# Patient Record
Sex: Female | Born: 1960 | Race: Black or African American | Hispanic: No | Smoking: Former smoker
Health system: Southern US, Community
[De-identification: ages and names within clinical notes are randomized; demographics above are authoritative.]

## PROBLEM LIST (undated history)

## (undated) DIAGNOSIS — R569 Unspecified convulsions: Secondary | ICD-10-CM

## (undated) DIAGNOSIS — E119 Type 2 diabetes mellitus without complications: Secondary | ICD-10-CM

## (undated) DIAGNOSIS — R7302 Impaired glucose tolerance (oral): Secondary | ICD-10-CM

## (undated) DIAGNOSIS — G4700 Insomnia, unspecified: Secondary | ICD-10-CM

## (undated) DIAGNOSIS — R42 Dizziness and giddiness: Secondary | ICD-10-CM

## (undated) DIAGNOSIS — K219 Gastro-esophageal reflux disease without esophagitis: Secondary | ICD-10-CM

## (undated) DIAGNOSIS — F329 Major depressive disorder, single episode, unspecified: Secondary | ICD-10-CM

## (undated) DIAGNOSIS — R7611 Nonspecific reaction to tuberculin skin test without active tuberculosis: Secondary | ICD-10-CM

## (undated) DIAGNOSIS — J4530 Mild persistent asthma, uncomplicated: Secondary | ICD-10-CM

## (undated) DIAGNOSIS — D259 Leiomyoma of uterus, unspecified: Secondary | ICD-10-CM

## (undated) DIAGNOSIS — F32A Depression, unspecified: Secondary | ICD-10-CM

## (undated) DIAGNOSIS — I10 Essential (primary) hypertension: Secondary | ICD-10-CM

## (undated) DIAGNOSIS — N183 Chronic kidney disease, stage 3 unspecified: Secondary | ICD-10-CM

## (undated) DIAGNOSIS — R079 Chest pain, unspecified: Secondary | ICD-10-CM

## (undated) DIAGNOSIS — G43911 Migraine, unspecified, intractable, with status migrainosus: Secondary | ICD-10-CM

## (undated) DIAGNOSIS — I739 Peripheral vascular disease, unspecified: Secondary | ICD-10-CM

## (undated) DIAGNOSIS — I6509 Occlusion and stenosis of unspecified vertebral artery: Secondary | ICD-10-CM

## (undated) DIAGNOSIS — Z8673 Personal history of transient ischemic attack (TIA), and cerebral infarction without residual deficits: Secondary | ICD-10-CM

## (undated) DIAGNOSIS — F411 Generalized anxiety disorder: Secondary | ICD-10-CM

## (undated) DIAGNOSIS — Z9289 Personal history of other medical treatment: Secondary | ICD-10-CM

## (undated) HISTORY — DX: Depression, unspecified: F32.A

## (undated) HISTORY — DX: Occlusion and stenosis of unspecified vertebral artery: I65.09

## (undated) HISTORY — DX: Chronic kidney disease, stage 3 (moderate): N18.3

## (undated) HISTORY — DX: Unspecified convulsions: R56.9

## (undated) HISTORY — DX: Personal history of transient ischemic attack (TIA), and cerebral infarction without residual deficits: Z86.73

## (undated) HISTORY — DX: Dizziness and giddiness: R42

## (undated) HISTORY — DX: Major depressive disorder, single episode, unspecified: F32.9

## (undated) HISTORY — DX: Nonspecific reaction to tuberculin skin test without active tuberculosis: R76.11

## (undated) HISTORY — DX: Peripheral vascular disease, unspecified: I73.9

## (undated) HISTORY — DX: Chronic kidney disease, stage 3 unspecified: N18.30

## (undated) HISTORY — DX: Mild persistent asthma, uncomplicated: J45.30

## (undated) HISTORY — DX: Leiomyoma of uterus, unspecified: D25.9

## (undated) HISTORY — DX: Migraine, unspecified, intractable, with status migrainosus: G43.911

## (undated) HISTORY — DX: Impaired glucose tolerance (oral): R73.02

## (undated) HISTORY — DX: Insomnia, unspecified: G47.00

## (undated) HISTORY — DX: Essential (primary) hypertension: I10

## (undated) HISTORY — DX: Chest pain, unspecified: R07.9

## (undated) HISTORY — DX: Generalized anxiety disorder: F41.1

## (undated) HISTORY — DX: Gastro-esophageal reflux disease without esophagitis: K21.9

## (undated) HISTORY — DX: Personal history of other medical treatment: Z92.89

---

## 1993-05-08 HISTORY — PX: PARTIAL HYSTERECTOMY: SHX80

## 2012-05-08 HISTORY — PX: COLONOSCOPY: SHX174

## 2015-02-10 ENCOUNTER — Ambulatory Visit (INDEPENDENT_AMBULATORY_CARE_PROVIDER_SITE_OTHER): Payer: 59 | Admitting: Internal Medicine

## 2015-02-10 ENCOUNTER — Other Ambulatory Visit (INDEPENDENT_AMBULATORY_CARE_PROVIDER_SITE_OTHER): Payer: 59

## 2015-02-10 ENCOUNTER — Encounter: Payer: Self-pay | Admitting: Internal Medicine

## 2015-02-10 VITALS — BP 120/80 | HR 52 | Temp 98.6°F | Ht 65.0 in | Wt 249.0 lb

## 2015-02-10 DIAGNOSIS — F329 Major depressive disorder, single episode, unspecified: Secondary | ICD-10-CM

## 2015-02-10 DIAGNOSIS — G43911 Migraine, unspecified, intractable, with status migrainosus: Secondary | ICD-10-CM

## 2015-02-10 DIAGNOSIS — N183 Chronic kidney disease, stage 3 unspecified: Secondary | ICD-10-CM | POA: Insufficient documentation

## 2015-02-10 DIAGNOSIS — R739 Hyperglycemia, unspecified: Secondary | ICD-10-CM

## 2015-02-10 DIAGNOSIS — Z23 Encounter for immunization: Secondary | ICD-10-CM | POA: Diagnosis not present

## 2015-02-10 DIAGNOSIS — Z8673 Personal history of transient ischemic attack (TIA), and cerebral infarction without residual deficits: Secondary | ICD-10-CM

## 2015-02-10 DIAGNOSIS — R0789 Other chest pain: Secondary | ICD-10-CM

## 2015-02-10 DIAGNOSIS — G43909 Migraine, unspecified, not intractable, without status migrainosus: Secondary | ICD-10-CM | POA: Insufficient documentation

## 2015-02-10 DIAGNOSIS — I10 Essential (primary) hypertension: Secondary | ICD-10-CM

## 2015-02-10 DIAGNOSIS — G4700 Insomnia, unspecified: Secondary | ICD-10-CM | POA: Diagnosis not present

## 2015-02-10 DIAGNOSIS — K219 Gastro-esophageal reflux disease without esophagitis: Secondary | ICD-10-CM

## 2015-02-10 DIAGNOSIS — F32A Depression, unspecified: Secondary | ICD-10-CM

## 2015-02-10 DIAGNOSIS — J4530 Mild persistent asthma, uncomplicated: Secondary | ICD-10-CM

## 2015-02-10 DIAGNOSIS — J45901 Unspecified asthma with (acute) exacerbation: Secondary | ICD-10-CM | POA: Insufficient documentation

## 2015-02-10 DIAGNOSIS — R7611 Nonspecific reaction to tuberculin skin test without active tuberculosis: Secondary | ICD-10-CM

## 2015-02-10 DIAGNOSIS — F411 Generalized anxiety disorder: Secondary | ICD-10-CM | POA: Insufficient documentation

## 2015-02-10 LAB — CBC WITH DIFFERENTIAL/PLATELET
BASOS ABS: 0.1 10*3/uL (ref 0.0–0.1)
Basophils Relative: 1.1 % (ref 0.0–3.0)
EOS PCT: 0.9 % (ref 0.0–5.0)
Eosinophils Absolute: 0.1 10*3/uL (ref 0.0–0.7)
HCT: 41.3 % (ref 36.0–46.0)
Hemoglobin: 13.5 g/dL (ref 12.0–15.0)
LYMPHS ABS: 2.5 10*3/uL (ref 0.7–4.0)
Lymphocytes Relative: 35.8 % (ref 12.0–46.0)
MCHC: 32.8 g/dL (ref 30.0–36.0)
MCV: 88.5 fl (ref 78.0–100.0)
MONO ABS: 0.6 10*3/uL (ref 0.1–1.0)
Monocytes Relative: 8.6 % (ref 3.0–12.0)
NEUTROS PCT: 53.6 % (ref 43.0–77.0)
Neutro Abs: 3.7 10*3/uL (ref 1.4–7.7)
Platelets: 294 10*3/uL (ref 150.0–400.0)
RBC: 4.66 Mil/uL (ref 3.87–5.11)
RDW: 14.2 % (ref 11.5–15.5)
WBC: 7 10*3/uL (ref 4.0–10.5)

## 2015-02-10 LAB — COMPREHENSIVE METABOLIC PANEL
ALK PHOS: 81 U/L (ref 39–117)
ALT: 17 U/L (ref 0–35)
AST: 16 U/L (ref 0–37)
Albumin: 4.2 g/dL (ref 3.5–5.2)
BUN: 17 mg/dL (ref 6–23)
CO2: 27 mEq/L (ref 19–32)
Calcium: 10.1 mg/dL (ref 8.4–10.5)
Chloride: 100 mEq/L (ref 96–112)
Creatinine, Ser: 1.1 mg/dL (ref 0.40–1.20)
GFR: 66.87 mL/min (ref >60.00)
GLUCOSE: 91 mg/dL (ref 70–99)
POTASSIUM: 3.7 meq/L (ref 3.5–5.1)
SODIUM: 135 meq/L (ref 135–145)
TOTAL PROTEIN: 7.4 g/dL (ref 6.0–8.3)
Total Bilirubin: 0.5 mg/dL (ref 0.2–1.2)

## 2015-02-10 LAB — LIPID PANEL
CHOL/HDL RATIO: 3
Cholesterol: 179 mg/dL (ref 0–200)
HDL: 51.7 mg/dL (ref >39.00)
LDL CALC: 93 mg/dL (ref 0–99)
NONHDL: 126.86
Triglycerides: 167 mg/dL — ABNORMAL HIGH (ref 0.0–149.0)
VLDL: 33.4 mg/dL (ref 0.0–40.0)

## 2015-02-10 LAB — HEMOGLOBIN A1C: Hgb A1c MFr Bld: 6.4 % (ref 4.6–6.5)

## 2015-02-10 LAB — TSH: TSH: 1.47 u[IU]/mL (ref 0.35–4.50)

## 2015-02-10 MED ORDER — BUPROPION HCL ER (XL) 300 MG PO TB24: 300.0000 mg | ORAL_TABLET | Freq: Every day | ORAL | Status: DC

## 2015-02-10 MED ORDER — CLOPIDOGREL BISULFATE 75 MG PO TABS: 75.0000 mg | ORAL_TABLET | Freq: Every day | ORAL | Status: DC

## 2015-02-10 MED ORDER — BUPROPION HCL ER (XL) 150 MG PO TB24: 150.0000 mg | ORAL_TABLET | Freq: Every day | ORAL | Status: DC

## 2015-02-10 MED ORDER — MONTELUKAST SODIUM 10 MG PO TABS: 10.0000 mg | ORAL_TABLET | Freq: Every day | ORAL | Status: DC

## 2015-02-10 NOTE — Assessment & Plan Note (Signed)
Well controlled today Continue current medication Check cmp

## 2015-02-10 NOTE — Assessment & Plan Note (Signed)
Takes a prescribed medication, but it does not help - advised her to stop it

## 2015-02-10 NOTE — Assessment & Plan Note (Signed)
Not controlled Restart prilosec Weight loss Avoid GERD triggers which we reviewed

## 2015-02-10 NOTE — Assessment & Plan Note (Signed)
Need to obtain old records Will refer to neuro Continue statin and plavix bp well controlled

## 2015-02-10 NOTE — Assessment & Plan Note (Signed)
Persistent and currently not controlled Her seasonal allergies and GERD are not controlled Start singulair Start prilosec Stressed changes in lifestyle to better control GERD

## 2015-02-10 NOTE — Assessment & Plan Note (Signed)
Check blood work Will refer to nephrology bp well controlled today

## 2015-02-10 NOTE — Patient Instructions (Signed)
Please have your records transferred.   Test(s) ordered today. Your results will be released to [place] (or called to you) after review, usually within 72hours after test completion. If any changes need to be made, you will be notified at that same time.  All other Health Maintenance issues reviewed.   All recommended immunizations and age-appropriate screenings are up-to-date.  Flu and tetanus administered today.   Medications reviewed and updated.  We will start Singulair today to help  Improve your asthma control.  You should also start back on the prilosec and take it daily.  Make the lifestyle changes we discussed to better control the heartburn.     Your prescription(s) have been submitted to your pharmacy. Please take as directed and contact our office if you believe you are having problem(s) with the medication(s).  A referral was ordered for cardiology, nephrology, neurology and psychiatry.    Please schedule followup in 3 months.  Gastroesophageal Reflux Disease, Adult Normally, food travels down the esophagus and stays in the stomach to be digested. However, when a person has gastroesophageal reflux disease (GERD), food and stomach acid move back up into the esophagus. When this happens, the esophagus becomes sore and inflamed. Over time, GERD can create small holes (ulcers) in the lining of the esophagus.  CAUSES This condition is caused by a problem with the muscle between the esophagus and the stomach (lower esophageal sphincter, or LES). Normally, the LES muscle closes after food passes through the esophagus to the stomach. When the LES is weakened or abnormal, it does not close properly, and that allows food and stomach acid to go back up into the esophagus. The LES can be weakened by certain dietary substances, medicines, and medical conditions, including:  Tobacco use.  Pregnancy.  Having a hiatal hernia.  Heavy alcohol use.  Certain foods and beverages, such as  coffee, chocolate, onions, and peppermint. RISK FACTORS This condition is more likely to develop in:  People who have an increased body weight.  People who have connective tissue disorders.  People who use NSAID medicines. SYMPTOMS Symptoms of this condition include:  Heartburn.  Difficult or painful swallowing.  The feeling of having a lump in the throat.  Abitter taste in the mouth.  Bad breath.  Having a large amount of saliva.  Having an upset or bloated stomach.  Belching.  Chest pain.  Shortness of breath or wheezing.  Ongoing (chronic) cough or a night-time cough.  Wearing away of tooth enamel.  Weight loss. Different conditions can cause chest pain. Make sure to see your health care provider if you experience chest pain. DIAGNOSIS Your health care provider will take a medical history and perform a physical exam. To determine if you have mild or severe GERD, your health care provider may also monitor how you respond to treatment. You may also have other tests, including:  An endoscopy toexamine your stomach and esophagus with a small camera.  A test thatmeasures the acidity level in your esophagus.  A test thatmeasures how much pressure is on your esophagus.  A barium swallow or modified barium swallow to show the shape, size, and functioning of your esophagus. TREATMENT The goal of treatment is to help relieve your symptoms and to prevent complications. Treatment for this condition may vary depending on how severe your symptoms are. Your health care provider may recommend:  Changes to your diet.  Medicine.  Surgery. HOME CARE INSTRUCTIONS Diet  Follow a diet as recommended by your health  care provider. This may involve avoiding foods and drinks such as:  Coffee and tea (with or without caffeine).  Drinks that containalcohol.  Energy drinks and sports drinks.  Carbonated drinks or sodas.  Chocolate and cocoa.  Peppermint and mint  flavorings.  Garlic and onions.  Horseradish.  Spicy and acidic foods, including peppers, chili powder, curry powder, vinegar, hot sauces, and barbecue sauce.  Citrus fruit juices and citrus fruits, such as oranges, lemons, and limes.  Tomato-based foods, such as red sauce, chili, salsa, and pizza with red sauce.  Fried and fatty foods, such as donuts, french fries, potato chips, and high-fat dressings.  High-fat meats, such as hot dogs and fatty cuts of red and white meats, such as rib eye steak, sausage, ham, and bacon.  High-fat dairy items, such as whole milk, butter, and cream cheese.  Eat small, frequent meals instead of large meals.  Avoid drinking large amounts of liquid with your meals.  Avoid eating meals during the 2-3 hours before bedtime.  Avoid lying down right after you eat.  Do not exercise right after you eat. General Instructions  Pay attention to any changes in your symptoms.  Take over-the-counter and prescription medicines only as told by your health care provider. Do not take aspirin, ibuprofen, or other NSAIDs unless your health care provider told you to do so.  Do not use any tobacco products, including cigarettes, chewing tobacco, and e-cigarettes. If you need help quitting, ask your health care provider.  Wear loose-fitting clothing. Do not wear anything tight around your waist that causes pressure on your abdomen.  Raise (elevate) the head of your bed 6 inches (15cm).  Try to reduce your stress, such as with yoga or meditation. If you need help reducing stress, ask your health care provider.  If you are overweight, reduce your weight to an amount that is healthy for you. Ask your health care provider for guidance about a safe weight loss goal.  Keep all follow-up visits as told by your health care provider. This is important. SEEK MEDICAL CARE IF:  You have new symptoms.  You have unexplained weight loss.  You have difficulty swallowing,  or it hurts to swallow.  You have wheezing or a persistent cough.  Your symptoms do not improve with treatment.  You have a hoarse voice. SEEK IMMEDIATE MEDICAL CARE IF:  You have pain in your arms, neck, jaw, teeth, or back.  You feel sweaty, dizzy, or light-headed.  You have chest pain or shortness of breath.  You vomit and your vomit looks like blood or coffee grounds.  You faint.  Your stool is bloody or black.  You cannot swallow, drink, or eat.   This information is not intended to replace advice given to you by your health care provider. Make sure you discuss any questions you have with your health care provider.   Document Released: 02/01/2005 Document Revised: 01/13/2015 Document Reviewed: 08/19/2014 Elsevier Interactive Patient Education [employer].

## 2015-02-10 NOTE — Progress Notes (Signed)
Subjective:    Patient ID: Michelle Nichols, female    DOB: Apr 28, 1961, 54 y.o.   MRN: [id number]  HPI She is here today to establish with a new pcp.  She is new to the area.  She would like a flu shot and needs a referral to several specialists she was following with where she used to live.   She has hypertension, chronic kidney disease and has been following with a kidney doctor.  She needs a referral.  Her last blood work was a few months ago.  She has asthma and has cough, wheezing and sob.  She uses both inhalers as prescribed.  Her seasonal allergies are not controlled and she has daily gerd symptoms.  She has taken prilosec in the past but is not currently taking it.  She is not taken any allergy medication.  Stroke:  She has had a stroke in the past that presented like a seizure.  She has no residual symptoms.  She was following with a neurologist and takes plavix.  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  She was also told she has blocked arteries in her right thigh and left groin.  She has chest pain intermittently.  She denies pain with activity.  She would like to see a cardiologist.   She has lightheadedness with changes in position.  She denies dizziness.    Depression, anxiety, insomnia:  She take wellbutrin and was following with a psychiatrist.  She would like to be referred to a psychiatrist.    Medications and allergies reviewed with patient and updated if appropriate.  Patient Active Problem List   Diagnosis Date Noted  . GERD (gastroesophageal reflux disease) 02/10/2015  . Chronic kidney disease, stage III (moderate) 02/10/2015  . Insomnia 02/10/2015  . Depression 02/10/2015  . Anxiety state 02/10/2015  . History of stroke without residual deficits 02/10/2015  . Asthma 02/10/2015  . Essential hypertension 02/10/2015  . Migraines 02/10/2015  . 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 Uterine fibroid     Past Surgical  History  Procedure Laterality Date  . Partial hysterectomy  1995    for fibroids  . Colonoscopy  2014  . Jerrye Bushy    . Ckd      Social History   Social History  . Marital Status: Divorced    Spouse Name: N/A  . Number of Children: N/A  . Years of Education: N/A   Social History Main Topics  . Smoking status: Former Smoker -- 1.50 packs/day for 20 years    Types: Cigarettes  . Smokeless tobacco: Never Used  . Alcohol Use: No  . Drug Use: No  . Sexual Activity: Not on file   Other Topics Concern  . Not on file   Social History Narrative   Live in companion      No regular exercise    Review of Systems  Constitutional: Positive for fatigue. Negative for fever, chills and unexpected weight change.       Insomnia  HENT: Positive for congestion (related to allergies) and rhinorrhea. Negative for sore throat.   Respiratory: Positive for cough, shortness of breath and wheezing.   Cardiovascular: Positive for chest pain. Negative for palpitations and leg swelling.  Gastrointestinal: Negative for vomiting, abdominal pain, diarrhea, constipation and blood in stool.       GERD daily  Neurological: Positive for light-headedness and headaches (migraines). Negative for dizziness, weakness and numbness.  Psychiatric/Behavioral: Positive for sleep disturbance and dysphoric mood. Negative for suicidal ideas. The patient is nervous/anxious.        Objective:   Filed Vitals:   02/10/15 1502  BP: 120/80  Pulse: 52  Temp: 98.6 F (37 C)   Filed Weights   02/10/15 1502  Weight: 249 lb (112.946 kg)   Body mass index is 41.44 kg/(m^2).   Physical Exam  Constitutional: She is oriented to person, place, and time. She appears well-developed and well-nourished. No distress.  obese  HENT:  Head: Normocephalic and atraumatic.  Right Ear: External ear normal.  Left Ear: External ear normal.  Mouth/Throat: Oropharynx is clear and moist.  Eyes: Conjunctivae are normal.  Neck: Neck  supple. No tracheal deviation present. No thyromegaly present.  Cardiovascular: Normal rate, regular rhythm and normal heart sounds.   No murmur heard. Pulmonary/Chest: Effort normal and breath sounds normal. No respiratory distress. She has no wheezes.  Abdominal: Soft. There is no tenderness.  Musculoskeletal: She exhibits no edema.  Lymphadenopathy:    She has no cervical adenopathy.  Neurological: She is alert and oriented to person, place, and time.  Skin: Skin is warm and dry. No rash noted.  Psychiatric: She has a normal mood and affect. Her behavior is normal.          Assessment & Plan:   Flu and tetanus today  See Problem List.  Chest pain Somewhat chronic and intermittent Not related to exertion Will refer to cardio - may warrant stress test given history  Hyperglycemia History of Will check a1c  She will get blood work done Follow up in 3 months, sooner if needed    More than 50 % of the 45 minute visit was spent coordinating care.

## 2015-02-10 NOTE — Assessment & Plan Note (Signed)
Feels depressed, not suicidal  Will refer to psych for anxiety, depression

## 2015-02-10 NOTE — Progress Notes (Signed)
Pre visit review using our clinic review tool, if applicable. No additional management support is needed unless otherwise documented below in the visit note. 

## 2015-02-10 NOTE — Assessment & Plan Note (Signed)
No treatment received, cxr neg asymptomatic

## 2015-02-11 ENCOUNTER — Encounter: Payer: Self-pay | Admitting: Internal Medicine

## 2015-02-11 DIAGNOSIS — R7303 Prediabetes: Secondary | ICD-10-CM

## 2015-02-15 MED ORDER — GLUCOSE BLOOD VI STRP
ORAL_STRIP | Status: DC
Start: 2015-02-15 — End: 2015-02-24

## 2015-02-15 MED ORDER — ONETOUCH ULTRASOFT LANCETS MISC: Status: DC

## 2015-02-15 MED ORDER — BLOOD GLUCOSE MONITOR KIT: PACK | Status: AC

## 2015-02-15 NOTE — Progress Notes (Signed)
Cardiology Office Note   Date:  02/16/2015   ID:  Michelle Nichols, DOB Jun 11, 1960, MRN [id number]  PCP:  Binnie Rail, MD  Cardiologist:  New - Dr. Quay Burow   Electrophysiologist:  n/a  Chief Complaint  Patient presents with  . Chest Pain     History of Present Illness: Michelle Nichols is a 54 y.o. female with a hx of HTN, CKD, asthma, allergic rhinitis, GERD, prior stroke, PAD, depression/anxiety.  Recently established with her PCP and is referred for evaluation of chest pain.    She moved here from [place] in July 2016.  She was previously followed by Dr. Jacquelyne Balint in [place], [place]. She notes a hx of PAD and vertebral artery stenosis. She had a stroke in 2012.  She tells me she had an echo that was "ok."  She also notes a hx of an ETT 2 years ago that was "ok."  She notes a hx of positional dizziness that seems to occur with lying flat.  She probably describes central vertigo related to her vertebral artery stenosis and prior CVA.  She started seeing Dr. Fredirick Lathe a couple years ago for LE claudication.  She had ABIs and PV angiogram.  She probably has R SFA and L CIA stenosis by her description.  She was seen by vascular surgery and was told she did not need surgery.  She was advised to start an exercise program.  She has never had a heart cath, MI, CHF. She notes a long hx of chest pain described as sharp associated with stress. She denies exertional chest pain.  She has DOE related to asthma without significant change.  She denies orthopnea, PND, edema.  She denies syncope.    Studies/Reports Reviewed Today:  None    Past Medical History  Diagnosis Date  . Uterine fibroid     s/p TAH  . Gastroesophageal reflux disease without esophagitis   . Chronic kidney disease, stage III (moderate)     a. Creatinine 10/16: 1.10  . Essential (primary) hypertension   . Insomnia   . Depression   . Mild persistent asthma in adult without complication   . Anxiety states   .  Intractable migraine with status migrainosus   . Glucose intolerance (impaired glucose tolerance)     a. A1c 10/16: 6.4  . 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 Seizure ([place])   . Vertigo   . PAD (peripheral artery disease) ([place])     a. Bilat vascular disease dx in [place] in 2014 with stable bilat LE claudication  . Vertebral artery stenosis   . History of CVA (cerebrovascular accident)     Past Surgical History  Procedure Laterality Date  . Partial hysterectomy  1995    for fibroids  . Colonoscopy  2014     Current Outpatient Prescriptions  Medication Sig Dispense Refill  . albuterol (PROVENTIL HFA;VENTOLIN HFA) 108 (90 BASE) MCG/ACT inhaler Inhale into the lungs every 6 (six) hours as needed for wheezing or shortness of breath.    Marland Kitchen atorvastatin (LIPITOR) 40 MG tablet Take 40 mg by mouth daily.    . blood glucose meter kit and supplies KIT Use meter to check blood sugar once per day or as directed by physician. 1 each 0  . buPROPion (WELLBUTRIN XL) 150 MG 24 hr tablet Take 1 tablet (150 mg total) by mouth daily. For total of 450 mg daily    . buPROPion (WELLBUTRIN XL) 300 MG 24 hr tablet Take 1 tablet (  300 mg total) by mouth daily. For total of 450 mg daily    . Cholecalciferol (VITAMIN D3) 5000 UNITS CAPS Take by mouth.    . clopidogrel (PLAVIX) 75 MG tablet Take 1 tablet (75 mg total) by mouth daily. 90 tablet 3  . diltiazem (DILACOR XR) 120 MG 24 hr capsule Take 120 mg by mouth daily.    . Fluticasone-Salmeterol (ADVAIR) 250-50 MCG/DOSE AEPB Inhale 1 puff into the lungs 2 (two) times daily.    Marland Kitchen glucose blood test strip Use as directed to check sugars once daily and prn 100 each 12  . isosorbide mononitrate (IMDUR) 30 MG 24 hr tablet Take 30 mg by mouth daily.    . Lancets (ONETOUCH ULTRASOFT) lancets Use as directed to check sugars once daily and prn 100 each 12  . montelukast (SINGULAIR) 10 MG tablet Take 1 tablet (10 mg total) by mouth at bedtime. 30 tablet 3  . Multiple Vitamin  (MULTIVITAMIN) tablet Take 1 tablet by mouth daily.    . Omega-3 Fatty Acids (OMEGA 3 PO) Take 2 tablets by mouth daily.     Marland Kitchen aspirin EC 81 MG tablet Take 1 tablet (81 mg total) by mouth daily.     No current facility-administered medications for this visit.    Allergies:   Review of patient's allergies indicates no known allergies.    Social History:   Social History   Social History  . Marital Status: Divorced    Spouse Name: N/A  . Number of Children: 4  . 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 Smoking status: Former Smoker -- 1.50 packs/day for 20 years    Types: Cigarettes  . Smokeless tobacco: Never Used  . Alcohol Use: No  . Drug Use: No  . Sexual Activity: Not Asked   Other Topics Concern  . None   Social History Narrative   Live in companion      No regular exercise    Family History:  The patient's family history includes Diabetes in her mother and sister; Hypertension in her mother and sister; Kidney disease in her maternal grandfather and mother.    ROS:   Please see the history of present illness.   Review of Systems  HENT: Positive for headaches.   Neurological: Positive for dizziness.  Psychiatric/Behavioral: Positive for depression. The patient is nervous/anxious.   All other systems reviewed and are negative.     PHYSICAL EXAM: VS:  BP 138/70 mmHg  Pulse 58  Ht _0  (1.651 m)  Wt 244 lb 1.9 oz (110.732 kg)  BMI 40.62 kg/m2    Wt Readings from Last 3 Encounters:  02/16/15 244 lb 1.9 oz (110.732 kg)  02/10/15 249 lb (112.946 kg)     GEN: Well nourished, well developed, in no acute distress HEENT: normal Neck: no JVD, no carotid bruits, no masses Cardiac:  Normal S1/S2, RRR; no murmur ,  no rubs or gallops, no edema; diminished PT/DP pulses bilaterally  Respiratory:  clear to auscultation bilaterally, no wheezing, rhonchi or rales. GI: soft, nontender, nondistended, +  BS MS: no deformity or atrophy Skin: warm and dry  Neuro:  CNs II-XII intact, Strength and sensation are intact Psych: Normal affect   EKG:  EKG is ordered today.  It demonstrates:   Sinus brady, HR 58, LAD, QTc 408 ms   Recent Labs: 02/10/2015: ALT 17; BUN 17; Creatinine, Ser 1.10; Hemoglobin  13.5; Platelets 294.0; Potassium 3.7; Sodium 135; TSH 1.47    Lipid Panel    Component Value Date/Time   CHOL 179 02/10/2015 1618   TRIG 167.0* 02/10/2015 1618   HDL 51.70 02/10/2015 1618   CHOLHDL 3 02/10/2015 1618   VLDL 33.4 02/10/2015 1618   LDLCALC 93 02/10/2015 1618      ASSESSMENT AND PLAN:  1. Chest Pain:  She has somewhat atypical chest pain.  However, she has multiple CRFs including PAD, HTN, glucose intolerance and prior smoking.  She does not believe she can walk on a treadmill given her claudication symptoms. I will arrange a Lexiscan Myoview to rule out ischemia.  She was given Imdur by her prior cardiologist.  She can continue this for now.   2. PAD:  She has bilateral LE vascular disease with fairly stable claudication.  I will request prior records from the cardiologist she saw in [place].  Will await these before we decide on when/if she needs FU testing performed. Continue ASA, Plavix, statin.  I reviewed her case with Dr. Lauree Chandler (DOD).  As her known disease is mainly PAD, will have her established with and followed by Dr. Quay Burow.   3. Vertebral Artery Stenosis:  Will request records as noted. She is due to see Neurology soon. Question if she has central vertigo.  Management per Neuro.  She may ultimately benefit from Vestibular Rehab.  Continue ASA, statin, Plavix.   4. HTN:  Controlled.  5. CKD:  Recent Creatinine normal.  She has a referral to Nephrology pending.  6. Glucose Intolerant:  Hgb A1c 10/16 was 6.4.  FU with PCP as directed.  7. Hx of CVA:  Continue ASA, Plavix, statin.     Medication Changes: Current medicines are reviewed at length  with the patient today.  Concerns regarding medicines are as outlined above.  The following changes have been made:   Discontinued Medications   No medications on file   Modified Medications   No medications on file   New Prescriptions   ASPIRIN EC 81 MG TABLET    Take 1 tablet (81 mg total) by mouth daily.   Labs/ tests ordered today include:   Orders Placed This Encounter  Procedures  . Myocardial Perfusion Imaging  . EKG 12-Lead      Disposition:    FU with Dr. Quay Burow 3 mos.     Signed, Versie Starks, MHS 02/16/2015 3:13 PM    [facility] [address], [place], [place]  [address] Phone: [phone]; Fax: [phone]

## 2015-02-15 NOTE — Telephone Encounter (Signed)
Glucometer kit has been sent to the pharmacy.   Forwarding the remainder of email from pt to PCP for review and advisement.

## 2015-02-15 NOTE — Addendum Note (Signed)
Addended by: Lowella Dandy on: 02/15/2015 08:11 AM   Modules accepted: Orders

## 2015-02-15 NOTE — Addendum Note (Signed)
Addended by: Binnie Rail on: 02/15/2015 12:57 PM   Modules accepted: Orders

## 2015-02-16 ENCOUNTER — Encounter: Payer: Self-pay | Admitting: Physician Assistant

## 2015-02-16 ENCOUNTER — Ambulatory Visit (INDEPENDENT_AMBULATORY_CARE_PROVIDER_SITE_OTHER): Payer: 59 | Admitting: Physician Assistant

## 2015-02-16 VITALS — BP 138/70 | HR 58 | Ht 65.0 in | Wt 244.1 lb

## 2015-02-16 DIAGNOSIS — N183 Chronic kidney disease, stage 3 unspecified: Secondary | ICD-10-CM

## 2015-02-16 DIAGNOSIS — R7302 Impaired glucose tolerance (oral): Secondary | ICD-10-CM

## 2015-02-16 DIAGNOSIS — D259 Leiomyoma of uterus, unspecified: Secondary | ICD-10-CM | POA: Insufficient documentation

## 2015-02-16 DIAGNOSIS — R079 Chest pain, unspecified: Secondary | ICD-10-CM | POA: Diagnosis not present

## 2015-02-16 DIAGNOSIS — J4530 Mild persistent asthma, uncomplicated: Secondary | ICD-10-CM | POA: Insufficient documentation

## 2015-02-16 DIAGNOSIS — Z8673 Personal history of transient ischemic attack (TIA), and cerebral infarction without residual deficits: Secondary | ICD-10-CM | POA: Insufficient documentation

## 2015-02-16 DIAGNOSIS — I6502 Occlusion and stenosis of left vertebral artery: Secondary | ICD-10-CM | POA: Diagnosis not present

## 2015-02-16 DIAGNOSIS — R0602 Shortness of breath: Secondary | ICD-10-CM | POA: Insufficient documentation

## 2015-02-16 DIAGNOSIS — I10 Essential (primary) hypertension: Secondary | ICD-10-CM

## 2015-02-16 DIAGNOSIS — I739 Peripheral vascular disease, unspecified: Secondary | ICD-10-CM

## 2015-02-16 DIAGNOSIS — R569 Unspecified convulsions: Secondary | ICD-10-CM | POA: Insufficient documentation

## 2015-02-16 DIAGNOSIS — G43911 Migraine, unspecified, intractable, with status migrainosus: Secondary | ICD-10-CM | POA: Insufficient documentation

## 2015-02-16 DIAGNOSIS — R7611 Nonspecific reaction to tuberculin skin test without active tuberculosis: Secondary | ICD-10-CM | POA: Insufficient documentation

## 2015-02-16 DIAGNOSIS — K219 Gastro-esophageal reflux disease without esophagitis: Secondary | ICD-10-CM | POA: Insufficient documentation

## 2015-02-16 DIAGNOSIS — R42 Dizziness and giddiness: Secondary | ICD-10-CM | POA: Insufficient documentation

## 2015-02-16 MED ORDER — ASPIRIN EC 81 MG PO TBEC: 81.0000 mg | DELAYED_RELEASE_TABLET | Freq: Every day | ORAL | Status: AC

## 2015-02-16 NOTE — Patient Instructions (Signed)
Medication Instructions:  1. OK TO RESUME IMDUR AND PLAVIX  2. ASPIRIN 81 MG DAILY HAS BEEN ADDED TO YOUR MEDICATION  Labwork: NONE  Testing/Procedures: Your physician has requested that you have a lexiscan myoview. For further information please visit [email or web address]. Please follow instruction sheet, as given.   Follow-Up: DR. Gwenlyn Found 3 MONTHS  Any Other Special Instructions Will Be Listed Below (If Applicable). WE WILL OBTAIN RECORDS FROM [place] FROM DR. Delford Field # [phone]; A MEDICAL RELEASE FORM WAS FILLED OUT TODAY AT YOUR VISIT

## 2015-02-17 ENCOUNTER — Ambulatory Visit (HOSPITAL_COMMUNITY): Payer: 59

## 2015-02-18 ENCOUNTER — Other Ambulatory Visit: Payer: Self-pay | Admitting: Emergency Medicine

## 2015-02-18 ENCOUNTER — Telehealth: Payer: Self-pay | Admitting: Internal Medicine

## 2015-02-18 MED ORDER — CLOPIDOGREL BISULFATE 75 MG PO TABS: 75.0000 mg | ORAL_TABLET | Freq: Every day | ORAL | Status: DC

## 2015-02-18 MED ORDER — DILTIAZEM HCL ER 120 MG PO CP24: 120.0000 mg | ORAL_CAPSULE | Freq: Every day | ORAL | Status: AC

## 2015-02-18 MED ORDER — CLOPIDOGREL BISULFATE 75 MG PO TABS: 75.0000 mg | ORAL_TABLET | Freq: Every day | ORAL | Status: AC

## 2015-02-18 NOTE — Telephone Encounter (Signed)
Patient states she needs rx for diltiazem and clopidogrel sent to [facility] on [address].

## 2015-02-18 NOTE — Telephone Encounter (Signed)
Michelle Nichols I think this was filled, but I can't tell - can you make sure.  I tried to resend it but it would not let me

## 2015-02-18 NOTE — Telephone Encounter (Signed)
[facility] for hep a/b vaccine.  Hep c - there is no injection - she should be screened for it.  We did not have time to discuss at her last visit but can address at her next visit.

## 2015-02-18 NOTE — Telephone Encounter (Signed)
Refills have been sent.  

## 2015-02-18 NOTE — Telephone Encounter (Signed)
Pt request to get injection for Hep A and B for her job and she said that on her chart, its said that she due for Hep C as well. Please advise, is it okey for pt to have theses injection?

## 2015-02-18 NOTE — Telephone Encounter (Signed)
Spoke with pt. Pt is going to call back and schedule nurse visit for the Hep A/B vaccine.

## 2015-02-19 ENCOUNTER — Encounter: Payer: Self-pay | Admitting: Internal Medicine

## 2015-02-22 ENCOUNTER — Telehealth (HOSPITAL_COMMUNITY): Payer: Self-pay | Admitting: *Deleted

## 2015-02-22 NOTE — Telephone Encounter (Signed)
Patient given detailed instructions per Myocardial Perfusion Study Information Sheet for test on 02/24/15/ at 1230. Patient notified to arrive 15 minutes early and that it is imperative to arrive on time for appointment to keep from having the test rescheduled.  If you need to cancel or reschedule your appointment, please call the office within 24 hours of your appointment. Failure to do so may result in a cancellation of your appointment, and a $50 no show fee. Patient verbalized understanding. Terressa Evola, [place]

## 2015-02-24 ENCOUNTER — Ambulatory Visit (HOSPITAL_COMMUNITY): Payer: 59 | Attending: Cardiology

## 2015-02-24 DIAGNOSIS — R079 Chest pain, unspecified: Secondary | ICD-10-CM | POA: Insufficient documentation

## 2015-02-24 DIAGNOSIS — I10 Essential (primary) hypertension: Secondary | ICD-10-CM | POA: Insufficient documentation

## 2015-02-24 DIAGNOSIS — I779 Disorder of arteries and arterioles, unspecified: Secondary | ICD-10-CM | POA: Insufficient documentation

## 2015-02-24 DIAGNOSIS — R0609 Other forms of dyspnea: Secondary | ICD-10-CM | POA: Insufficient documentation

## 2015-02-24 MED ORDER — TECHNETIUM TC 99M SESTAMIBI GENERIC - CARDIOLITE
33.0000 | Freq: Once | INTRAVENOUS | Status: AC | PRN
  Administered 2015-02-24: 33 via INTRAVENOUS

## 2015-02-24 MED ORDER — GLUCOSE BLOOD VI STRP: ORAL_STRIP | Status: AC

## 2015-02-24 MED ORDER — REGADENOSON 0.4 MG/5ML IV SOLN
0.4000 mg | Freq: Once | INTRAVENOUS | Status: AC
  Administered 2015-02-24: 0.4 mg via INTRAVENOUS

## 2015-02-24 NOTE — Addendum Note (Signed)
Addended by: Lowella Dandy on: 02/24/2015 10:43 AM   Modules accepted: Orders, Medications

## 2015-02-25 ENCOUNTER — Encounter: Payer: Self-pay | Admitting: Physician Assistant

## 2015-02-25 ENCOUNTER — Telehealth: Payer: Self-pay | Admitting: *Deleted

## 2015-02-25 ENCOUNTER — Ambulatory Visit (HOSPITAL_COMMUNITY): Payer: 59 | Attending: Cardiology

## 2015-02-25 DIAGNOSIS — R0989 Other specified symptoms and signs involving the circulatory and respiratory systems: Secondary | ICD-10-CM

## 2015-02-25 LAB — MYOCARDIAL PERFUSION IMAGING
CHL CUP NUCLEAR SDS: 6
CHL CUP NUCLEAR SRS: 0
LHR: 0.28
LV dias vol: 100 mL
LVSYSVOL: 44 mL
Peak HR: 91 {beats}/min
Rest HR: 58 {beats}/min
SSS: 6
TID: 0.92

## 2015-02-25 MED ORDER — TECHNETIUM TC 99M SESTAMIBI GENERIC - CARDIOLITE
32.7000 | Freq: Once | INTRAVENOUS | Status: AC | PRN
  Administered 2015-02-25: 32.7 via INTRAVENOUS

## 2015-02-25 NOTE — Telephone Encounter (Signed)
Pt notified of myoview results by phone with verbal understanding 

## 2015-03-02 ENCOUNTER — Ambulatory Visit (INDEPENDENT_AMBULATORY_CARE_PROVIDER_SITE_OTHER): Payer: 59

## 2015-03-02 ENCOUNTER — Telehealth: Payer: Self-pay

## 2015-03-02 DIAGNOSIS — Z23 Encounter for immunization: Secondary | ICD-10-CM

## 2015-03-02 NOTE — Telephone Encounter (Signed)
Patient left form to be completed by dr burns---completed form has been completed by dr burns and is on Michelle Nichols's desk---patient is to call back with fax number to fax form to---if patient calls back, please give fax number to Arienne Gartin and i will fax form

## 2015-03-03 ENCOUNTER — Encounter: Payer: Self-pay | Admitting: Internal Medicine

## 2015-03-03 DIAGNOSIS — I779 Disorder of arteries and arterioles, unspecified: Secondary | ICD-10-CM | POA: Insufficient documentation

## 2015-03-03 DIAGNOSIS — I7090 Unspecified atherosclerosis: Secondary | ICD-10-CM | POA: Insufficient documentation

## 2015-03-03 DIAGNOSIS — I739 Peripheral vascular disease, unspecified: Secondary | ICD-10-CM | POA: Insufficient documentation

## 2015-03-05 ENCOUNTER — Telehealth: Payer: Self-pay

## 2015-03-05 NOTE — Telephone Encounter (Signed)
Tried to call patient to advise that form is completed and ready for her to pick up, no answer, no way to leave message on cell phone voice mail

## 2015-03-07 ENCOUNTER — Other Ambulatory Visit: Payer: Self-pay | Admitting: Internal Medicine

## 2015-03-09 ENCOUNTER — Encounter: Payer: Self-pay | Admitting: Internal Medicine

## 2015-03-09 ENCOUNTER — Ambulatory Visit (INDEPENDENT_AMBULATORY_CARE_PROVIDER_SITE_OTHER): Payer: 59 | Admitting: Internal Medicine

## 2015-03-09 VITALS — BP 144/88 | HR 58 | Temp 99.0°F | Resp 18 | Wt 244.0 lb

## 2015-03-09 DIAGNOSIS — J069 Acute upper respiratory infection, unspecified: Secondary | ICD-10-CM | POA: Diagnosis not present

## 2015-03-09 LAB — POCT RAPID STREP A (OFFICE): RAPID STREP A SCREEN: NEGATIVE

## 2015-03-09 NOTE — Progress Notes (Signed)
Subjective:    Patient ID: Michelle Nichols, female    DOB: January 20, 1961, 54 y.o.   MRN: [id number]  HPI She is here for an acute visit for cold symptoms.    Her symptoms started yesterday.  She states a sore throat associated with pain/difficulty swallowing, muscle aches, chills, subjective fevers, right ear pain, right sided gland pain and swelling in her neck.  She has a cough and mild sob, but denies significant wheezing.  She had a headache.  She has been drinking - tea with honey, chicken broth, water, soup. She has not taken any otc cold medications or tylenol.    Medications and allergies reviewed with patient and updated if appropriate.  Patient Active Problem List   Diagnosis Date Noted  . Carotid artery disease ([place]) 03/03/2015  . Peripheral vascular disease ([place]) 03/03/2015  . Atherosclerosis 03/03/2015  . Uterine fibroid   . Gastroesophageal reflux disease without esophagitis   . Essential (primary) hypertension   . Mild persistent asthma in adult without complication   . Hx of stroke without residual deficits   . Intractable migraine with status migrainosus   . Chest pain   . 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 Wheezing   . SOB (shortness of breath)   . Seizure ([place])   . Lightheadedness   . Prediabetes 02/11/2015  . Chronic kidney disease, stage III (moderate) 02/10/2015  . Insomnia 02/10/2015  . Depression 02/10/2015  . Anxiety state 02/10/2015  . Asthma 02/10/2015  . Essential hypertension 02/10/2015  . Migraines 02/10/2015  . 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 Hyperglycemia 02/10/2015    Past Medical History  Diagnosis Date  . Uterine fibroid     s/p TAH  . Gastroesophageal reflux disease without esophagitis   . Chronic kidney disease, stage III (moderate)     a. Creatinine 10/16: 1.10  . Essential (primary) hypertension   . Insomnia   . Depression   . Mild persistent asthma in adult without complication   . Anxiety states   . Intractable migraine with status  migrainosus   . Glucose intolerance (impaired glucose tolerance)     a. A1c 10/16: 6.4  . 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 Seizure ([place])   . Vertigo   . PAD (peripheral artery disease) ([place])     a. Bilat vascular disease dx in [place] in 2014 with stable bilat LE claudication  . Vertebral artery stenosis   . History of CVA (cerebrovascular accident)   . History of nuclear stress test     Myoview 10/16: EF 56%, low risk, normal perfusion    Past Surgical History  Procedure Laterality Date  . Partial hysterectomy  1995    for fibroids  . Colonoscopy  2014    Social History   Social History  . Marital Status: Divorced    Spouse Name: N/A  . Number of Children: 4  . 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 Smoking status: Former Smoker -- 1.50 packs/day for 20 years    Types: Cigarettes  . Smokeless tobacco: Never Used  . Alcohol Use: No  . Drug Use: No  . Sexual Activity: Not Asked   Other Topics Concern  . None   Social History Narrative   Live in companion      No regular exercise    Review of Systems  Constitutional: Positive for fever, chills and fatigue.  HENT: Positive  for congestion (at night ), ear pain (right only), sinus pressure, sore throat and trouble swallowing (pain with swallowing).   Respiratory: Positive for cough (mild) and shortness of breath (mild). Negative for wheezing.   Cardiovascular: Negative for chest pain.  Gastrointestinal: Positive for diarrhea. Negative for nausea and abdominal pain.  Musculoskeletal: Positive for myalgias.  Neurological: Positive for headaches. Negative for dizziness and light-headedness.       Objective:   Filed Vitals:   03/09/15 1440  BP: 144/88  Pulse: 58  Temp: 99 F (37.2 C)  Resp: 18   Filed Weights   03/09/15 1440  Weight: 244 lb (110.678 kg)   Body mass index is 40.6 kg/(m^2).   Physical Exam  Constitutional: She appears well-developed  and well-nourished. She appears distressed (mild distress, appears mild-moderately ill).  HENT:  Head: Normocephalic and atraumatic.  Right Ear: External ear normal.  Left Ear: External ear normal.  Mouth/Throat: No oropharyngeal exudate.  Mild oropharynx erythema, TM's b/l normal  Eyes: Conjunctivae are normal.  Neck: No tracheal deviation present.  Cardiovascular: Normal rate, regular rhythm and normal heart sounds.   Pulmonary/Chest: Effort normal and breath sounds normal. No respiratory distress. She has no wheezes.  Abdominal: Soft. There is no tenderness.  Musculoskeletal: She exhibits no edema.  Lymphadenopathy:    She has cervical adenopathy (tender on right side).        Assessment & Plan:   URI Rapid strep and flu test negative Likely viral Start tylenol according to package instructions Take otc cold medications if needed Rest, fluids Use your inhalers  Note given for work   Call if no improvement or any worsening

## 2015-03-09 NOTE — Progress Notes (Signed)
Pre visit review using our clinic review tool, if applicable. No additional management support is needed unless otherwise documented below in the visit note. 

## 2015-03-09 NOTE — Patient Instructions (Addendum)
Your rapid strep test and flu test are negative.  You have a viral infection and no antibiotic is going to help.  You should continue increased rest and fluids.  You should start taking tylenol according to the package instructions.  You should start using your inhalers to prevent a flare of your asthma.  If your symptoms worsen you should call.  Most likely you will continue to have cold symptoms for up to 2 weeks.    Upper Respiratory Infection, Adult Most upper respiratory infections (URIs) are a viral infection of the air passages leading to the lungs. A URI affects the nose, throat, and upper air passages. The most common type of URI is nasopharyngitis and is typically referred to as "the common cold." URIs run their course and usually go away on their own. Most of the time, a URI does not require medical attention, but sometimes a bacterial infection in the upper airways can follow a viral infection. This is called a secondary infection. Sinus and middle ear infections are common types of secondary upper respiratory infections. Bacterial pneumonia can also complicate a URI. A URI can worsen asthma and chronic obstructive pulmonary disease (COPD). Sometimes, these complications can require emergency medical care and may be life threatening.  CAUSES Almost all URIs are caused by viruses. A virus is a type of germ and can spread from one person to another.  RISKS FACTORS You may be at risk for a URI if:   You smoke.   You have chronic heart or lung disease.  You have a weakened defense (immune) system.   You are very young or very old.   You have nasal allergies or asthma.  You work in crowded or poorly ventilated areas.  You work in health care facilities or schools. SIGNS AND SYMPTOMS  Symptoms typically develop 2-3 days after you come in contact with a cold virus. Most viral URIs last 7-10 days. However, viral URIs from the influenza virus (flu virus) can last 14-18 days and are  typically more severe. Symptoms may include:   Runny or stuffy (congested) nose.   Sneezing.   Cough.   Sore throat.   Headache.   Fatigue.   Fever.   Loss of appetite.   Pain in your forehead, behind your eyes, and over your cheekbones (sinus pain).  Muscle aches.  DIAGNOSIS  Your health care provider may diagnose a URI by:  Physical exam.  Tests to check that your symptoms are not due to another condition such as:  Strep throat.  Sinusitis.  Pneumonia.  Asthma. TREATMENT  A URI goes away on its own with time. It cannot be cured with medicines, but medicines may be prescribed or recommended to relieve symptoms. Medicines may help:  Reduce your fever.  Reduce your cough.  Relieve nasal congestion. HOME CARE INSTRUCTIONS   Take medicines only as directed by your health care provider.   Gargle warm saltwater or take cough drops to comfort your throat as directed by your health care provider.  Use a warm mist humidifier or inhale steam from a shower to increase air moisture. This may make it easier to breathe.  Drink enough fluid to keep your urine clear or pale yellow.   Eat soups and other clear broths and maintain good nutrition.   Rest as needed.   Return to work when your temperature has returned to normal or as your health care provider advises. You may need to stay home longer to avoid infecting others.  You can also use a face mask and careful hand washing to prevent spread of the virus.  Increase the usage of your inhaler if you have asthma.   Do not use any tobacco products, including cigarettes, chewing tobacco, or electronic cigarettes. If you need help quitting, ask your health care provider. PREVENTION  The best way to protect yourself from getting a cold is to practice good hygiene.   Avoid oral or hand contact with people with cold symptoms.   Wash your hands often if contact occurs.  There is no clear evidence that  vitamin C, vitamin E, echinacea, or exercise reduces the chance of developing a cold. However, it is always recommended to get plenty of rest, exercise, and practice good nutrition.  SEEK MEDICAL CARE IF:   You are getting worse rather than better.   Your symptoms are not controlled by medicine.   You have chills.  You have worsening shortness of breath.  You have brown or red mucus.  You have yellow or brown nasal discharge.  You have pain in your face, especially when you bend forward.  You have a fever.  You have swollen neck glands.  You have pain while swallowing.  You have white areas in the back of your throat. SEEK IMMEDIATE MEDICAL CARE IF:   You have severe or persistent:  Headache.  Ear pain.  Sinus pain.  Chest pain.  You have chronic lung disease and any of the following:  Wheezing.  Prolonged cough.  Coughing up blood.  A change in your usual mucus.  You have a stiff neck.  You have changes in your:  Vision.  Hearing.  Thinking.  Mood. MAKE SURE YOU:   Understand these instructions.  Will watch your condition.  Will get help right away if you are not doing well or get worse.   This information is not intended to replace advice given to you by your health care provider. Make sure you discuss any questions you have with your health care provider.   Document Released: 10/18/2000 Document Revised: 09/08/2014 Document Reviewed: 07/30/2013 Elsevier Interactive Patient Education [employer].

## 2015-03-16 ENCOUNTER — Encounter: Payer: 59 | Attending: Internal Medicine | Admitting: *Deleted

## 2015-03-16 ENCOUNTER — Encounter: Payer: Self-pay | Admitting: Internal Medicine

## 2015-03-16 ENCOUNTER — Telehealth: Payer: Self-pay | Admitting: Internal Medicine

## 2015-03-16 ENCOUNTER — Encounter: Payer: Self-pay | Admitting: *Deleted

## 2015-03-16 DIAGNOSIS — R7303 Prediabetes: Secondary | ICD-10-CM | POA: Diagnosis present

## 2015-03-16 DIAGNOSIS — Z713 Dietary counseling and surveillance: Secondary | ICD-10-CM | POA: Insufficient documentation

## 2015-03-16 NOTE — Progress Notes (Signed)
Diabetes Self-Management Education  Visit Type: First/Initial  Appt. Start Time: 0900 Appt. End Time: 1000  03/16/2015  Michelle Nichols, identified by name and date of birth, is a 54 y.o. female with a diagnosis of Diabetes: Pre-Diabetes.   ASSESSMENT  Michelle Nichols states she has depression and anxiety.  She is seeing a psychiatrist, but not a therapist.  A1 is 6.4% and currently not on medications      Diabetes Self-Management Education - 03/16/15 0926    Visit Information   Visit Type First/Initial   Initial Visit   Diabetes Type Pre-Diabetes   Are you currently following a meal plan? No   Are you taking your medications as prescribed? Yes   Date Diagnosed 02/2015   Health Coping   How would you rate your overall health? Good   Psychosocial Assessment   Patient Belief/Attitude about Diabetes Motivated to manage diabetes   Self-care barriers None   Other persons present Patient   Patient Concerns Nutrition/Meal planning   Special Needs None   How often do you need to have someone help you when you read instructions, pamphlets, or other written materials from your doctor or pharmacy? 1 - Never   What is the last grade level you completed in school? some college   Complications   Last HgB A1C per patient/outside source 6.4 %   How often do you check your blood sugar? 1-2 times/day   Fasting Blood glucose range (mg/dL) 70-129   Postprandial Blood glucose range (mg/dL) 180-200   Number of hypoglycemic episodes per month 0   Number of hyperglycemic episodes per week 1   Have you had a dilated eye exam in the past 12 months? Yes   Have you had a dental exam in the past 12 months? No   Are you checking your feet? No   Dietary Intake   Breakfast apple, green juice (vegetable juice)   Snack (afternoon) 3 chicken wings   Dinner salad   Beverage(s) water   Exercise   Exercise Type Moderate (swimming / aerobic walking)   How many days per week to you exercise? 5   How many  minutes per day do you exercise? 30   Total minutes per week of exercise 150   Patient Education   Previous Diabetes Education No   Disease state  Definition of diabetes, type 1 and 2, and the diagnosis of diabetes;Factors that contribute to the development of diabetes;Explored patient's options for treatment of their diabetes   Nutrition management  Role of diet in the treatment of diabetes and the relationship between the three main macronutrients and blood glucose level;Food label reading, portion sizes and measuring food.;Carbohydrate counting   Physical activity and exercise  Role of exercise on diabetes management, blood pressure control and cardiac health.   Medications Other (comment)   Monitoring Taught/discussed recording of test results and interpretation of SMBG.   Psychosocial adjustment Role of stress on diabetes   Individualized Goals (developed by patient)   Nutrition Follow meal plan discussed   Physical Activity Exercise 5-7 days per week   Health Coping ask for help with (comment)   Outcomes   Expected Outcomes Other (comment)  mental health may affect outcomes if untreated   Future DMSE 4-6 wks      Individualized Plan for Diabetes Self-Management Training:   Learning Objective:  Patient will have a greater understanding of diabetes self-management. Patient education plan is to attend individual and/or group sessions per assessed needs and concerns.  Plan:   Patient Instructions  [facility] [phone] [facility] [phone] [facility] [phone]  Consider talking with doctor about medications  Thanksgiving: Still follow MyPlate recommendations Take a walk afterwards    Expected Outcomes:  Other (comment) (mental health may affect outcomes if untreated)  Education material provided: Living Well with Diabetes, Meal plan card and Snack sheet  If problems or questions, patient to contact team via:  Phone  Future  DSME appointment: 4-6 wks

## 2015-03-16 NOTE — Telephone Encounter (Signed)
Rec'd from [place] forward 48 pages to Dr. Quay Burow

## 2015-03-16 NOTE — Patient Instructions (Addendum)
[facility] [phone] [facility] [phone] [facility] [phone]  Consider talking with doctor about medications  Thanksgiving: Still follow MyPlate recommendations Take a walk afterwards

## 2015-03-17 NOTE — Telephone Encounter (Signed)
Please advise 

## 2015-03-18 ENCOUNTER — Encounter: Payer: Self-pay | Admitting: Internal Medicine

## 2015-03-18 MED ORDER — METFORMIN HCL 500 MG PO TABS
500.0000 mg | ORAL_TABLET | Freq: Every day | ORAL | Status: DC
Start: 2015-03-18 — End: 2016-01-05

## 2015-03-19 ENCOUNTER — Ambulatory Visit (INDEPENDENT_AMBULATORY_CARE_PROVIDER_SITE_OTHER): Payer: 59 | Admitting: Internal Medicine

## 2015-03-19 ENCOUNTER — Ambulatory Visit: Payer: 59 | Admitting: Internal Medicine

## 2015-03-19 ENCOUNTER — Encounter: Payer: Self-pay | Admitting: Internal Medicine

## 2015-03-19 VITALS — BP 136/88 | HR 61 | Temp 97.7°F | Resp 18 | Wt 243.0 lb

## 2015-03-19 DIAGNOSIS — B349 Viral infection, unspecified: Secondary | ICD-10-CM

## 2015-03-19 NOTE — Progress Notes (Signed)
Pre visit review using our clinic review tool, if applicable. No additional management support is needed unless otherwise documented below in the visit note. 

## 2015-03-19 NOTE — Patient Instructions (Signed)
   Viral Infections °A virus is a type of germ. Viruses can cause: °· Minor sore throats. °· Aches and pains. °· Headaches. °· Runny nose. °· Rashes. °· Watery eyes. °· Tiredness. °· Coughs. °· Loss of appetite. °· Feeling sick to your stomach (nausea). °· Throwing up (vomiting). °· Watery poop (diarrhea). °HOME CARE  °· Only take medicines as told by your doctor. °· Drink enough water and fluids to keep your pee (urine) clear or pale yellow. Sports drinks are a good choice. °· Get plenty of rest and eat healthy. Soups and broths with crackers or rice are fine. °GET HELP RIGHT AWAY IF:  °· You have a very bad headache. °· You have shortness of breath. °· You have chest pain or neck pain. °· You have an unusual rash. °· You cannot stop throwing up. °· You have watery poop that does not stop. °· You cannot keep fluids down. °· You or your child has a temperature by mouth above 102° F (38.9° C), not controlled by medicine. °· Your baby is older than 3 months with a rectal temperature of 102° F (38.9° C) or higher. °· Your baby is 3 months old or younger with a rectal temperature of 100.4° F (38° C) or higher. °MAKE SURE YOU:  °· Understand these instructions. °· Will watch this condition. °· Will get help right away if you are not doing well or get worse. °  °This information is not intended to replace advice given to you by your health care provider. Make sure you discuss any questions you have with your health care provider. °  °Document Released: 04/06/2008 Document Revised: 07/17/2011 Document Reviewed: 09/30/2014 °Elsevier Interactive Patient Education ©2016 Elsevier Inc. ° °

## 2015-03-19 NOTE — Progress Notes (Signed)
Subjective:    Patient ID: Michelle Nichols, female    DOB: Apr 15, 1961, 54 y.o.   MRN: [id number]  HPI   She is here today for an acute visit for cold symptoms.   Her symptoms started last night.  She started having intermittent chills, sweats and subjective fevers.  She did not check her temperature. This morning she had difficulty catching her breath when she uses her rescue inhaler, but it did not help. She states right sided jaw pain when she eats. She has decreased hearing in the right ear. She states nasal congestion, sinus pressure, sore throat, dry cough, wheeze and shortness of breath at times.  She has been having headaches, body aches and lightheadedness only when she lays down. She has been drinking plenty of fluids and ginger tea and has been using cough drops.   Medications and allergies reviewed with patient and updated if appropriate.  Patient Active Problem List   Diagnosis Date Noted  . Carotid artery disease ([place]) 03/03/2015  . Peripheral vascular disease ([place]) 03/03/2015  . Atherosclerosis 03/03/2015  . Uterine fibroid   . Gastroesophageal reflux disease without esophagitis   . Essential (primary) hypertension   . Mild persistent asthma in adult without complication   . Hx of stroke without residual deficits   . Intractable migraine with status migrainosus   . Chest pain   . 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 Wheezing   . SOB (shortness of breath)   . Seizure ([place])   . Lightheadedness   . Prediabetes 02/11/2015  . Chronic kidney disease, stage III (moderate) 02/10/2015  . Insomnia 02/10/2015  . Depression 02/10/2015  . Anxiety state 02/10/2015  . Asthma 02/10/2015  . Essential hypertension 02/10/2015  . Migraines 02/10/2015  . 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 Hyperglycemia 02/10/2015    Current Outpatient Prescriptions on File Prior to Visit  Medication Sig Dispense Refill  . albuterol (PROVENTIL HFA;VENTOLIN HFA) 108 (90 BASE) MCG/ACT inhaler Inhale into the  lungs every 6 (six) hours as needed for wheezing or shortness of breath.    Marland Kitchen aspirin EC 81 MG tablet Take 1 tablet (81 mg total) by mouth daily.    Marland Kitchen atorvastatin (LIPITOR) 40 MG tablet Take 40 mg by mouth daily.    . blood glucose meter kit and supplies KIT Use meter to check blood sugar once per day or as directed by physician. 1 each 0  . buPROPion (WELLBUTRIN XL) 150 MG 24 hr tablet Take 1 tablet (150 mg total) by mouth daily. For total of 450 mg daily    . buPROPion (WELLBUTRIN XL) 300 MG 24 hr tablet Take 1 tablet (300 mg total) by mouth daily. For total of 450 mg daily    . Cholecalciferol (VITAMIN D3) 5000 UNITS CAPS Take by mouth.    . clopidogrel (PLAVIX) 75 MG tablet Take 1 tablet (75 mg total) by mouth daily. 90 tablet 3  . diltiazem (DILACOR XR) 120 MG 24 hr capsule Take 1 capsule (120 mg total) by mouth daily. 90 capsule 3  . Fluticasone-Salmeterol (ADVAIR) 250-50 MCG/DOSE AEPB Inhale 1 puff into the lungs 2 (two) times daily.    Marland Kitchen glucose blood test strip Use as directed to check sugars up to 4 times a day. DX E11.09 400 each 3  . isosorbide mononitrate (IMDUR) 30 MG 24 hr tablet Take 30 mg by mouth daily.    . Lancets (ONETOUCH ULTRASOFT) lancets Use as directed to check sugars once daily and prn  100 each 12  . metFORMIN (GLUCOPHAGE) 500 MG tablet Take 1 tablet (500 mg total) by mouth daily with supper. 90 tablet 1  . montelukast (SINGULAIR) 10 MG tablet Take 1 tablet (10 mg total) by mouth at bedtime. 30 tablet 3  . Multiple Vitamin (MULTIVITAMIN) tablet Take 1 tablet by mouth daily.    . Omega-3 Fatty Acids (OMEGA 3 PO) Take 2 tablets by mouth daily.      No current facility-administered medications on file prior to visit.    Past Medical History  Diagnosis Date  . Uterine fibroid     s/p TAH  . Gastroesophageal reflux disease without esophagitis   . Chronic kidney disease, stage III (moderate)     a. Creatinine 10/16: 1.10  . Essential (primary) hypertension   .  Insomnia   . Depression   . Mild persistent asthma in adult without complication   . Anxiety states   . Intractable migraine with status migrainosus   . Glucose intolerance (impaired glucose tolerance)     a. A1c 10/16: 6.4  . 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 Seizure ([place])   . Vertigo   . PAD (peripheral artery disease) ([place])     a. Bilat vascular disease dx in [place] in 2014 with stable bilat LE claudication  . Vertebral artery stenosis   . History of CVA (cerebrovascular accident)   . History of nuclear stress test     Myoview 10/16: EF 56%, low risk, normal perfusion    Past Surgical History  Procedure Laterality Date  . Partial hysterectomy  1995    for fibroids  . Colonoscopy  2014    Social History   Social History  . Marital Status: Divorced    Spouse Name: N/A  . Number of Children: 4  . 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 Smoking status: Former Smoker -- 1.50 packs/day for 20 years    Types: Cigarettes  . Smokeless tobacco: Never Used  . Alcohol Use: No  . Drug Use: No  . Sexual Activity: Not Asked   Other Topics Concern  . None   Social History Narrative   Live in [place]      No regular exercise    Review of Systems  Constitutional: Positive for fever (subjective), chills and diaphoresis.  HENT: Positive for congestion, ear pain (right ear only), sinus pressure and sore throat.   Respiratory: Positive for cough (minimal), chest tightness, shortness of breath (a little) and wheezing.   Cardiovascular: Negative for chest pain and palpitations.  Gastrointestinal: Negative for nausea, abdominal pain and diarrhea.  Musculoskeletal: Positive for myalgias (whole body).  Neurological: Positive for light-headedness (when lays down) and headaches.       Objective:   Filed Vitals:   03/19/15 1029  BP: 136/88  Pulse: 61  Temp: 97.7 F (36.5 C)  Resp: 18   Filed Weights   03/19/15 1029    Weight: 243 lb (110.224 kg)   Body mass index is 40.44 kg/(m^2).   Physical Exam GENERAL APPEARANCE: Appears stated age, well appearing, NAD EYES: conjunctiva clear, no icterus HEENT: bilateral tympanic membranes and ear canals normal, oropharynx with mild erythema, no thyromegaly, trachea midline, no cervical or supraclavicular lymphadenopathy LUNGS: Clear to auscultation without wheeze or crackles, unlabored breathing, good air entry bilaterally HEART: Normal S1,S2 without murmurs EXTREMITIES: Without clubbing, cyanosis, or edema        Assessment &  Plan:   Viral illness, URI Her symptoms are consistent with a viral illness-at bedtime started last night, but I still feel this is viral in nature No need for an antibiotic Lungs are clear and she does not need steroids Continue rescue inhaler as needed Increase rest and fluids Continue Tylenol Cough drops and other cold medications as needed Note given for work-to return next Tuesday, she is not feeling better  Call or return if her symptoms worsen or do not improve

## 2015-03-21 ENCOUNTER — Encounter: Payer: Self-pay | Admitting: Internal Medicine

## 2015-03-22 NOTE — Telephone Encounter (Signed)
Please call her - besides the body aches, sore throat and fatigue - is she still having the other cold symptoms (nasal congestion, sinus pain, cough, wheeze)?

## 2015-03-22 NOTE — Telephone Encounter (Signed)
Please advise 

## 2015-03-23 ENCOUNTER — Telehealth: Payer: Self-pay | Admitting: Neurology

## 2015-03-23 ENCOUNTER — Encounter: Payer: Self-pay | Admitting: Internal Medicine

## 2015-03-23 NOTE — Telephone Encounter (Signed)
Pt canceled her appt on 03-25-15 due to virus and will call back to resch

## 2015-03-25 ENCOUNTER — Ambulatory Visit: Payer: 59 | Admitting: Neurology

## 2015-03-26 ENCOUNTER — Telehealth: Payer: Self-pay | Admitting: Internal Medicine

## 2015-03-26 NOTE — Telephone Encounter (Signed)
LVM for pt to call back.

## 2015-03-26 NOTE — Telephone Encounter (Signed)
Patient called back stating she is feeling a little better and if she starts to feel real bad she will give [place] a call

## 2015-04-06 ENCOUNTER — Ambulatory Visit (INDEPENDENT_AMBULATORY_CARE_PROVIDER_SITE_OTHER): Payer: 59

## 2015-04-06 DIAGNOSIS — Z23 Encounter for immunization: Secondary | ICD-10-CM | POA: Diagnosis not present

## 2015-04-23 ENCOUNTER — Ambulatory Visit (INDEPENDENT_AMBULATORY_CARE_PROVIDER_SITE_OTHER): Payer: 59 | Admitting: Psychiatry

## 2015-04-23 ENCOUNTER — Encounter (INDEPENDENT_AMBULATORY_CARE_PROVIDER_SITE_OTHER): Payer: Self-pay

## 2015-04-23 ENCOUNTER — Encounter (HOSPITAL_COMMUNITY): Payer: Self-pay | Admitting: Psychiatry

## 2015-04-23 VITALS — BP 140/97 | HR 88 | Ht 65.0 in | Wt 244.8 lb

## 2015-04-23 DIAGNOSIS — F4310 Post-traumatic stress disorder, unspecified: Secondary | ICD-10-CM

## 2015-04-23 DIAGNOSIS — F331 Major depressive disorder, recurrent, moderate: Secondary | ICD-10-CM | POA: Diagnosis not present

## 2015-04-23 MED ORDER — TRAZODONE HCL 50 MG PO TABS: 50.0000 mg | ORAL_TABLET | Freq: Every day | ORAL | Status: DC

## 2015-04-23 MED ORDER — LAMOTRIGINE 25 MG PO TABS: ORAL_TABLET | ORAL | Status: DC

## 2015-04-23 MED ORDER — BUPROPION HCL ER (XL) 300 MG PO TB24: 300.0000 mg | ORAL_TABLET | Freq: Every day | ORAL | Status: DC
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
## 2015-04-27 ENCOUNTER — Encounter: Payer: 59 | Attending: Internal Medicine | Admitting: *Deleted

## 2015-04-27 ENCOUNTER — Encounter: Payer: Self-pay | Admitting: Internal Medicine

## 2015-04-27 DIAGNOSIS — N183 Chronic kidney disease, stage 3 unspecified: Secondary | ICD-10-CM

## 2015-04-27 DIAGNOSIS — J45909 Unspecified asthma, uncomplicated: Secondary | ICD-10-CM

## 2015-04-27 DIAGNOSIS — I10 Essential (primary) hypertension: Secondary | ICD-10-CM

## 2015-04-27 DIAGNOSIS — Z713 Dietary counseling and surveillance: Secondary | ICD-10-CM | POA: Insufficient documentation

## 2015-04-27 DIAGNOSIS — R7303 Prediabetes: Secondary | ICD-10-CM | POA: Insufficient documentation

## 2015-04-27 DIAGNOSIS — R739 Hyperglycemia, unspecified: Secondary | ICD-10-CM

## 2015-04-27 NOTE — Patient Instructions (Addendum)
Aim to ride stationary bike 30 minutes 3 days/wek Do Shaun T video 2 days/week Aim to have 3 meals/day: need to have carbohydrates and proteins with each meal, plus your veggies Remember that poor nutrition makes depression and anxiety worse Get rid of the scale!!!

## 2015-04-27 NOTE — Progress Notes (Signed)
  Medical Nutrition Therapy:  Appt start time: 1000 end time:  1030.  Assessment:  Primary concerns today: [name] is here for follow up nutrition counseling pertaining to obesity and prediabetes.Michelle Nichols  Has been to see 2 psychiatrists and has also been referred to therapist.   Has been advised to walk, pray, read Bible for stress management Has been advised to lose weight.  She is very emotional about her weight.  Yesterday morning she got up and went to the gym She has brought with her today her food containers to portion out her foods States she wants to lose weight that she has never been this big before in her life  Is adament about doing intense exercise even though she has not exercised in months and she has not been able to do intense exercise or extended exercise.  She does not want to "start low and go slow."  She gets frustrated with herself for not being able to do exercises like she would like to and then she does no exercise.  She has not been consistent with any recommendations as her depression was worsened over the past several weeks.  Several changes have been made to her medications.   MEDICATIONS: see list  DIETARY INTAKE: 24 hour recall B: none L: none S: chips D:Steak, mac-n-cheese, big bowl salad Beverages: water, 2 glasses wine  Recent physical activity: went to the gym yesterday  Estimated energy needs: 1800-2000 calories 200 g carbohydrates 135 g protein 50 g fat  Progress Towards Goal(s):  No progress.   Nutritional Diagnosis:  [place]-2.1 Inpaired nutrition utilization As related to carbohydrates.  As evidenced by A1c 6.4%.    Intervention:  Nutrition counseling provided.  Emphasized health vs weight and how her health would benefit from lifestyle changes, but her weight may or may not.  When she focuses on weight loss she becomes frustrated.  Need to focus on lowering her blood glucose levels through food and physical activity.  Advised setting attainable goals  and then changing those goals as she meets them.  Patient did not agree.  Advised she will be setting herself up for failure (yesterday she wanted to go to the gym twice, but didn't so she got upset and didn't go this morning)  Goals: Aim to ride stationary bike 30 minutes 3 days/wek Do Shaun T video 2 days/week Aim to have 3 meals/day: need to have carbohydrates and proteins with each meal, plus your veggies Remember that poor nutrition makes depression and anxiety worse   Monitoring/Evaluation:  Dietary intake, exercise, glucose levels, and body weight in 2 month(s).

## 2015-05-13 ENCOUNTER — Telehealth (HOSPITAL_COMMUNITY): Payer: Self-pay

## 2015-05-13 ENCOUNTER — Encounter: Payer: Self-pay | Admitting: Internal Medicine

## 2015-05-13 NOTE — Telephone Encounter (Signed)
Telephone call with patient who reported she has been taking Lorazepam 1 mg, one twice a day as needed and states she informed Dr. Adele Schilder of this at initial evaluation on 04/23/15 and agreed to call back once she started running low.  Patient states she has been taking twice a day and only has 3 pills remaining.  Patient requests a new order of Lorazepam but not mentioned during first evaluation.

## 2015-05-13 NOTE — Telephone Encounter (Signed)
Medication refill request - Telephone message left for pt. after she left one requesting a refill of Lorazapam.  Informed we did not have it documented that Dr. Adele Schilder was prescribing this or that pt was taking and reqeusted she call back to discuss.   Informed on message this nurse wanted to verify which medication she was needing and dosage. Will await call back to clarify.

## 2015-05-14 ENCOUNTER — Encounter: Payer: Self-pay | Admitting: Internal Medicine

## 2015-05-14 ENCOUNTER — Other Ambulatory Visit (HOSPITAL_COMMUNITY): Payer: Self-pay | Admitting: Psychiatry

## 2015-05-14 NOTE — Telephone Encounter (Signed)
She did not mention lorazepam on her initial evaluation.  She must bring old prescription or bottle.  I will discuss this issue on her next appointment.

## 2015-05-14 NOTE — Telephone Encounter (Signed)
Telephone message left for patient Dr. Adele Schilder reported he did not recall patient discussing Lorazepam with him during initial evaluation and that patient would have to bring in an old bottle or something to verify patient had been taking Lorazepam in the past as he did not want to start patient on a Benzodiazepine medication.  Informed patient on message Dr. Adele Schilder would discuss with her further if needed at evaluation on 05/26/15.  Patient to call back if needed.

## 2015-05-25 ENCOUNTER — Other Ambulatory Visit (INDEPENDENT_AMBULATORY_CARE_PROVIDER_SITE_OTHER): Payer: BLUE CROSS/BLUE SHIELD

## 2015-05-25 ENCOUNTER — Encounter: Payer: Self-pay | Admitting: Cardiovascular Disease

## 2015-05-25 ENCOUNTER — Ambulatory Visit (INDEPENDENT_AMBULATORY_CARE_PROVIDER_SITE_OTHER): Payer: BLUE CROSS/BLUE SHIELD | Admitting: Internal Medicine

## 2015-05-25 ENCOUNTER — Ambulatory Visit (INDEPENDENT_AMBULATORY_CARE_PROVIDER_SITE_OTHER): Payer: BLUE CROSS/BLUE SHIELD | Admitting: Cardiovascular Disease

## 2015-05-25 ENCOUNTER — Encounter: Payer: Self-pay | Admitting: Internal Medicine

## 2015-05-25 VITALS — BP 124/80 | HR 59 | Temp 98.1°F | Resp 16 | Wt 242.0 lb

## 2015-05-25 DIAGNOSIS — I10 Essential (primary) hypertension: Secondary | ICD-10-CM | POA: Diagnosis not present

## 2015-05-25 DIAGNOSIS — I739 Peripheral vascular disease, unspecified: Secondary | ICD-10-CM

## 2015-05-25 DIAGNOSIS — E559 Vitamin D deficiency, unspecified: Secondary | ICD-10-CM

## 2015-05-25 DIAGNOSIS — Z139 Encounter for screening, unspecified: Secondary | ICD-10-CM

## 2015-05-25 DIAGNOSIS — R7303 Prediabetes: Secondary | ICD-10-CM

## 2015-05-25 DIAGNOSIS — E785 Hyperlipidemia, unspecified: Secondary | ICD-10-CM | POA: Insufficient documentation

## 2015-05-25 DIAGNOSIS — M722 Plantar fascial fibromatosis: Secondary | ICD-10-CM

## 2015-05-25 LAB — COMPREHENSIVE METABOLIC PANEL
ALK PHOS: 87 U/L (ref 39–117)
ALT: 21 U/L (ref 0–35)
AST: 26 U/L (ref 0–37)
Albumin: 4.4 g/dL (ref 3.5–5.2)
BUN: 16 mg/dL (ref 6–23)
CHLORIDE: 102 meq/L (ref 96–112)
CO2: 30 meq/L (ref 19–32)
Calcium: 9.8 mg/dL (ref 8.4–10.5)
Creatinine, Ser: 1.43 mg/dL — ABNORMAL HIGH (ref 0.40–1.20)
GFR: 49.35 mL/min — AB (ref >60.00)
GLUCOSE: 97 mg/dL (ref 70–99)
POTASSIUM: 3.9 meq/L (ref 3.5–5.1)
SODIUM: 138 meq/L (ref 135–145)
TOTAL PROTEIN: 7.4 g/dL (ref 6.0–8.3)
Total Bilirubin: 0.6 mg/dL (ref 0.2–1.2)

## 2015-05-25 LAB — CBC WITH DIFFERENTIAL/PLATELET
BASOS ABS: 0 10*3/uL (ref 0.0–0.1)
Basophils Relative: 0 % (ref 0–1)
EOS ABS: 0.1 10*3/uL (ref 0.0–0.7)
EOS PCT: 1 % (ref 0–5)
HEMATOCRIT: 38.5 % (ref 36.0–46.0)
Hemoglobin: 12.6 g/dL (ref 12.0–15.0)
LYMPHS PCT: 43 % (ref 12–46)
Lymphs Abs: 2.5 10*3/uL (ref 0.7–4.0)
MCH: 28.7 pg (ref 26.0–34.0)
MCHC: 32.7 g/dL (ref 30.0–36.0)
MCV: 87.7 fL (ref 78.0–100.0)
MONO ABS: 0.4 10*3/uL (ref 0.1–1.0)
MPV: 9.7 fL (ref 8.6–12.4)
Monocytes Relative: 7 % (ref 3–12)
Neutro Abs: 2.8 10*3/uL (ref 1.7–7.7)
Neutrophils Relative %: 49 % (ref 43–77)
PLATELETS: 286 10*3/uL (ref 150–400)
RBC: 4.39 MIL/uL (ref 3.87–5.11)
RDW: 14.5 % (ref 11.5–15.5)
WBC: 5.8 10*3/uL (ref 4.0–10.5)

## 2015-05-25 LAB — HEMOGLOBIN A1C: HEMOGLOBIN A1C: 6.5 % (ref 4.6–6.5)

## 2015-05-25 NOTE — Patient Instructions (Addendum)
We have reviewed your prior records including labs and tests today.  Test(s) ordered today. Your results will be released to [place] (or called to you) after review, usually within 72hours after test completion. If any changes need to be made, you will be notified at that same time.  All other Health Maintenance issues reviewed.   All recommended immunizations and age-appropriate screenings are up-to-date.  No immunizations administered today.   Medications reviewed and updated.  No changes recommended at this time.  A referral was ordered for vascular surgery.  Please schedule followup in 6 months  [facility] [place], [place] [address] from [facility] [phone]  [place], [place]    Plantar Fasciitis With Rehab The plantar fascia is a fibrous, ligament-like, soft-tissue structure that spans the bottom of the foot. Plantar fasciitis, also called heel spur syndrome, is a condition that causes pain in the foot due to inflammation of the tissue. SYMPTOMS   Pain and tenderness on the underneath side of the foot.  Pain that worsens with standing or walking. CAUSES  Plantar fasciitis is caused by irritation and injury to the plantar fascia on the underneath side of the foot. Common mechanisms of injury include:  Direct trauma to bottom of the foot.  Damage to a small nerve that runs under the foot where the main fascia attaches to the heel bone.  Stress placed on the plantar fascia due to bone spurs. RISK INCREASES WITH:   Activities that place stress on the plantar fascia (running, jumping, pivoting, or cutting).  Poor strength and flexibility.  Improperly fitted shoes.  Tight calf muscles.  Flat feet.  Failure to warm-up properly before activity.  Obesity. PREVENTION  Warm up  and stretch properly before activity.  Allow for adequate recovery between workouts.  Maintain physical fitness:  Strength, flexibility, and endurance.  Cardiovascular fitness.  Maintain a health body weight.  Avoid stress on the plantar fascia.  Wear properly fitted shoes, including arch supports for individuals who have flat feet. PROGNOSIS  If treated properly, then the symptoms of plantar fasciitis usually resolve without surgery. However, occasionally surgery is necessary. RELATED COMPLICATIONS   Recurrent symptoms that may result in a chronic condition.  Problems of the lower back that are caused by compensating for the injury, such as limping.  Pain or weakness of the foot during push-off following surgery.  Chronic inflammation, scarring, and partial or complete fascia tear, occurring more often from repeated injections. TREATMENT  Treatment initially involves the use of ice and medication to help reduce pain and inflammation. The use of strengthening and stretching exercises may help reduce pain with activity, especially stretches of the Achilles tendon. These exercises may be performed at home or with a therapist. Your caregiver may recommend that you use heel cups of arch supports to help reduce stress on the plantar fascia. Occasionally, corticosteroid injections are given to reduce inflammation. If symptoms persist for greater than 6 months despite non-surgical (conservative), then surgery may be recommended.  MEDICATION   If pain medication is necessary, then nonsteroidal anti-inflammatory medications, such as aspirin and ibuprofen, or other minor pain relievers, such as acetaminophen, are often recommended.  Do not take pain medication within 7 days before surgery.  Prescription pain relievers may be given if deemed necessary by your caregiver. Use only as directed and only as much as you  need.  Corticosteroid injections may be given by your caregiver. These  injections should be reserved for the most serious cases, because they may only be given a certain number of times. HEAT AND COLD  Cold treatment (icing) relieves pain and reduces inflammation. Cold treatment should be applied for 10 to 15 minutes every 2 to 3 hours for inflammation and pain and immediately after any activity that aggravates your symptoms. Use ice packs or massage the area with a piece of ice (ice massage).  Heat treatment may be used prior to performing the stretching and strengthening activities prescribed by your caregiver, physical therapist, or athletic trainer. Use a heat pack or soak the injury in warm water. SEEK IMMEDIATE MEDICAL CARE IF:  Treatment seems to offer no benefit, or the condition worsens.  Any medications produce adverse side effects. EXERCISES RANGE OF MOTION (ROM) AND STRETCHING EXERCISES - Plantar Fasciitis (Heel Spur Syndrome) These exercises may help you when beginning to rehabilitate your injury. Your symptoms may resolve with or without further involvement from your physician, physical therapist or athletic trainer. While completing these exercises, remember:   Restoring tissue flexibility helps normal motion to return to the joints. This allows healthier, less painful movement and activity.  An effective stretch should be held for at least 30 seconds.  A stretch should never be painful. You should only feel a gentle lengthening or release in the stretched tissue. RANGE OF MOTION - Toe Extension, Flexion  Sit with your right / left leg crossed over your opposite knee.  Grasp your toes and gently pull them back toward the top of your foot. You should feel a stretch on the bottom of your toes and/or foot.  Hold this stretch for __________ seconds.  Now, gently pull your toes toward the bottom of your foot. You should feel a stretch on the top of your toes and or foot.  Hold this stretch for __________ seconds. Repeat __________ times.  Complete this stretch __________ times per day.  RANGE OF MOTION - Ankle Dorsiflexion, Active Assisted  Remove shoes and sit on a chair that is preferably not on a carpeted surface.  Place right / left foot under knee. Extend your opposite leg for support.  Keeping your heel down, slide your right / left foot back toward the chair until you feel a stretch at your ankle or calf. If you do not feel a stretch, slide your bottom forward to the edge of the chair, while still keeping your heel down.  Hold this stretch for __________ seconds. Repeat __________ times. Complete this stretch __________ times per day.  STRETCH - Gastroc, Standing  Place hands on wall.  Extend right / left leg, keeping the front knee somewhat bent.  Slightly point your toes inward on your back foot.  Keeping your right / left heel on the floor and your knee straight, shift your weight toward the wall, not allowing your back to arch.  You should feel a gentle stretch in the right / left calf. Hold this position for __________ seconds. Repeat __________ times. Complete this stretch __________ times per day. STRETCH - [place]us, Standing  Place hands on wall.  Extend right / left leg, keeping the other knee somewhat bent.  Slightly point your toes inward on your back foot.  Keep your right / left heel on the floor, bend your back knee, and slightly shift your weight over the back leg so that you feel a gentle stretch deep in your back calf.  Hold this position for __________ seconds. Repeat __________ times. Complete this stretch __________ times per day. STRETCH - Gastrocsoleus, Standing  Note: This exercise can place a lot of stress on your foot and ankle. Please complete this exercise only if specifically instructed by your caregiver.   Place the ball of your right / left foot on a step, keeping your other foot firmly on the same step.  Hold on to the wall or a rail for balance.  Slowly lift your other  foot, allowing your body weight to press your heel down over the edge of the step.  You should feel a stretch in your right / left calf.  Hold this position for __________ seconds.  Repeat this exercise with a slight bend in your right / left knee. Repeat __________ times. Complete this stretch __________ times per day.  STRENGTHENING EXERCISES - Plantar Fasciitis (Heel Spur Syndrome)  These exercises may help you when beginning to rehabilitate your injury. They may resolve your symptoms with or without further involvement from your physician, physical therapist or athletic trainer. While completing these exercises, remember:   Muscles can gain both the endurance and the strength needed for everyday activities through controlled exercises.  Complete these exercises as instructed by your physician, physical therapist or athletic trainer. Progress the resistance and repetitions only as guided. STRENGTH - Towel Curls  Sit in a chair positioned on a non-carpeted surface.  Place your foot on a towel, keeping your heel on the floor.  Pull the towel toward your heel by only curling your toes. Keep your heel on the floor.  If instructed by your physician, physical therapist or athletic trainer, add ____________________ at the end of the towel. Repeat __________ times. Complete this exercise __________ times per day. STRENGTH - Ankle Inversion  Secure one end of a rubber exercise band/tubing to a fixed object (table, pole). Loop the other end around your foot just before your toes.  Place your fists between your knees. This will focus your strengthening at your ankle.  Slowly, pull your big toe up and in, making sure the band/tubing is positioned to resist the entire motion.  Hold this position for __________ seconds.  Have your muscles resist the band/tubing as it slowly pulls your foot back to the starting position. Repeat __________ times. Complete this exercises __________ times per  day.    This information is not intended to replace advice given to you by your health care provider. Make sure you discuss any questions you have with your health care provider.   Document Released: 04/24/2005 Document Revised: 09/08/2014 Document Reviewed: 08/06/2008 Elsevier Interactive Patient Education [employer].

## 2015-05-25 NOTE — Assessment & Plan Note (Addendum)
Will refer to vascular surgery Continue plavix and asa unless cardio/vascular feel she does not need both Increase exercise

## 2015-05-25 NOTE — Assessment & Plan Note (Signed)
Last a1c 6.4% Tolerating metformin and sugars 120-130's Increase exercise Work on weight loss Has seen nutrition Check a1c today Continue metformin

## 2015-05-25 NOTE — Progress Notes (Signed)
Pre visit review using our clinic review tool, if applicable. No additional management support is needed unless otherwise documented below in the visit note. 

## 2015-05-25 NOTE — Assessment & Plan Note (Signed)
History of peripheral arterial disease with previous documented left iliac and right SFA disease. She does complain of left calf claudication. We will get lower extremity arterial Doppler studies.

## 2015-05-25 NOTE — Assessment & Plan Note (Signed)
History of hyperlipidemia on statin therapy. 

## 2015-05-25 NOTE — Assessment & Plan Note (Signed)
Discussed stretches - info given  Advised to get new shoes and change regularly If no improvement should see podiatry

## 2015-05-25 NOTE — Progress Notes (Signed)
05/25/2015 Vertell Novak   [birth year]  [id number]  Primary Physician Binnie Rail, MD Primary Cardiologist: Lorretta Harp MD Michelle Nichols   HPI:  Miss Newcomer is a 55 year old moderately overweight divorced African-American female mother of 41, grandmother of 7 grandchildren relocated from [place] because of her job. She has home health aide. She initially saw Richardson Dopp PAC back in October. She has a history of discontinued tobacco abuse 4 years ago as well as treated hypertension, hypokalemia and diabetes. 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 There is no family history. She has documented peripheral vascular disease with left iliac and right SFA documented disease by angiography. She does complain of left calf claudication. She has psychiatric history as well with regards to stress and bipolar disorder. She's underlying stress recently has had progressive chest pain worse today. The pain is somewhat atypical. Constant and does not radiate.   Current Outpatient Prescriptions  Medication Sig Dispense Refill  . albuterol (PROVENTIL HFA;VENTOLIN HFA) 108 (90 BASE) MCG/ACT inhaler Inhale into the lungs every 6 (six) hours as needed for wheezing or shortness of breath.    Marland Kitchen aspirin EC 81 MG tablet Take 1 tablet (81 mg total) by mouth daily.    Marland Kitchen atorvastatin (LIPITOR) 40 MG tablet Take 40 mg by mouth daily.    . blood glucose meter kit and supplies KIT Use meter to check blood sugar once per day or as directed by physician. 1 each 0  . buPROPion (WELLBUTRIN XL) 300 MG 24 hr tablet Take 1 tablet (300 mg total) by mouth daily. 30 tablet 1  . Cholecalciferol (VITAMIN D3) 5000 UNITS CAPS Take by mouth.    . clopidogrel (PLAVIX) 75 MG tablet Take 1 tablet (75 mg total) by mouth daily. 90 tablet 3  . diltiazem (DILACOR XR) 120 MG 24 hr capsule Take 1 capsule (120 mg total) by mouth daily. 90 capsule 3  . Fluticasone-Salmeterol  (ADVAIR) 250-50 MCG/DOSE AEPB Inhale 1 puff into the lungs 2 (two) times daily.    Marland Kitchen glucose blood test strip Use as directed to check sugars up to 4 times a day. DX E11.09 400 each 3  . isosorbide mononitrate (IMDUR) 30 MG 24 hr tablet Take 30 mg by mouth daily.    Marland Kitchen lamoTRIgine (LAMICTAL) 25 MG tablet Take 1 tab daily for 1 week and than 2 tab daily 60 tablet 1  . Lancets (ONETOUCH ULTRASOFT) lancets Use as directed to check sugars once daily and prn 100 each 12  . metFORMIN (GLUCOPHAGE) 500 MG tablet Take 1 tablet (500 mg total) by mouth daily with supper. 90 tablet 1  . montelukast (SINGULAIR) 10 MG tablet Take 1 tablet (10 mg total) by mouth at bedtime. 30 tablet 3  . Multiple Vitamin (MULTIVITAMIN) tablet Take 1 tablet by mouth daily.    . Omega-3 Fatty Acids (OMEGA 3 PO) Take 2 tablets by mouth daily.     . traZODone (DESYREL) 50 MG tablet Take 1 tablet (50 mg total) by mouth at bedtime. 30 tablet 1   No current facility-administered medications for this visit.    No Known Allergies  Social History   Social History  . Marital Status: Divorced    Spouse Name: N/A  . Number of Children: 4  . 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 Smoking  status: Former Smoker -- 1.50 packs/day for 20 years    Types: Cigarettes  . Smokeless tobacco: Never Used  . Alcohol Use: No  . Drug Use: No  . Sexual Activity: Not on file   Other Topics Concern  . Not on file   Social History Narrative   Live in companion      No regular exercise     Review of Systems: General: negative for chills, fever, night sweats or weight changes.  Cardiovascular: negative for chest pain, dyspnea on exertion, edema, orthopnea, palpitations, paroxysmal nocturnal dyspnea or shortness of breath Dermatological: negative for rash Respiratory: negative for cough or wheezing Urologic: negative for hematuria Abdominal: negative for nausea, vomiting,  diarrhea, bright red blood per rectum, melena, or hematemesis Neurologic: negative for visual changes, syncope, or dizziness All other systems reviewed and are otherwise negative except as noted above.    Blood pressure 120/60, pulse 61, height _0  (1.651 m), weight 242 lb 1.6 oz (109.816 kg).  General appearance: alert and no distress Neck: no adenopathy, no carotid bruit, no JVD, supple, symmetrical, trachea midline and thyroid not enlarged, symmetric, no tenderness/mass/nodules Lungs: clear to auscultation bilaterally Heart: regular rate and rhythm, S1, S2 normal, no murmur, click, rub or gallop Extremities: extremities normal, atraumatic, no cyanosis or edema  EKG sinus rhythm of 61 with nonspecific ST or T-wave changes. I personally reviewed this EKG  ASSESSMENT AND PLAN:   Peripheral vascular disease ([place]) History of peripheral arterial disease with previous documented left iliac and right SFA disease. She does complain of left calf claudication. We will get lower extremity arterial Doppler studies.  Chest pain Mrs. Dicesare has been complaining of chest pain for several weeks worse today. She was seen by Richardson Dopp as a new patient evaluation 02/16/15. Subsequent Myoview performed one week later was completely normal. I've offered her transfer to the hospital today by EMS but she has declined. She would rather pursue outpatient clinic catheterization which I will arrange as soon as possible. Her pain sounds somewhat atypical. Her EKG shows no acute changes.  I have reviewed the risks, indications, and alternatives to cardiac catheterization, possible angioplasty, and stenting with the patient. Risks include but are not limited to bleeding, infection, vascular injury, stroke, myocardial infection, arrhythmia, kidney injury, radiation-related injury in the case of prolonged fluoroscopy use, emergency cardiac surgery, and death. The patient understands the risks of serious complication is  1-2 in 1975 with diagnostic cardiac cath and 1-2% or less with angioplasty/stenting.   Essential hypertension History of hypertension blood pressure measures 120/60. She is on diltiazem. Continue current meds at current dosing  Hyperlipidemia History of hyperlipidemia on statin therapy.      Lorretta Harp MD FACP,FACC,FAHA, [facility] 05/25/2015 3:04 PM

## 2015-05-25 NOTE — Assessment & Plan Note (Signed)
History of hypertension blood pressure measures 120/60. She is on diltiazem. Continue current meds at current dosing

## 2015-05-25 NOTE — Assessment & Plan Note (Signed)
BP Readings from Last 3 Encounters:  05/25/15 124/80  04/23/15 140/97  03/19/15 136/88   bp well controlled today Continue low sodium diet Increase exercise Work on weight loss Continue current meds

## 2015-05-25 NOTE — Assessment & Plan Note (Signed)
Taking 5000 units daily Check vitamin d level

## 2015-05-25 NOTE — Assessment & Plan Note (Addendum)
Michelle Nichols has been complaining of chest pain for several weeks worse today. She was seen by Richardson Dopp as a new patient evaluation 02/16/15. Subsequent Myoview performed one week later was completely normal. I've offered her transfer to the hospital today by EMS but she has declined. She would rather pursue outpatient clinic catheterization which I will arrange as soon as possible. Her pain sounds somewhat atypical. Her EKG shows no acute changes.  I have reviewed the risks, indications, and alternatives to cardiac catheterization, possible angioplasty, and stenting with the patient. Risks include but are not limited to bleeding, infection, vascular injury, stroke, myocardial infection, arrhythmia, kidney injury, radiation-related injury in the case of prolonged fluoroscopy use, emergency cardiac surgery, and death. The patient understands the risks of serious complication is 1-2 in [id number] with diagnostic cardiac cath and 1-2% or less with angioplasty/stenting.

## 2015-05-25 NOTE — Patient Instructions (Addendum)
  Medication Instructions:  Your physician recommends that you continue on your current medications as directed. Please refer to the Current Medication list given to you today.   Labwork: Your physician recommends that you return for lab work in: [place] - Today   Testing/Procedures: Your physician has requested that you have a lower extremity arterial doppler- During this test, ultrasound is used to evaluate arterial blood flow in the legs. Allow approximately one hour for this exam.   Your physician has requested that you have a cardiac catheterization. Cardiac catheterization is used to diagnose and/or treat various heart conditions. Doctors may recommend this procedure for a number of different reasons. The most common reason is to evaluate chest pain. Chest pain can be a symptom of coronary artery disease (CAD), and cardiac catheterization can show whether plaque is narrowing or blocking your heart's arteries. This procedure is also used to evaluate the valves, as well as measure the blood flow and oxygen levels in different parts of your heart. For further information please visit [email or web address]. Please follow instruction sheet, as give [place] a call. SCHEDULE FOR Thursday OR 1/19 Monday 1/23  Any Other Special Instructions Will Be Listed Below (If Applicable).     If you need a refill on your cardiac medications before your next appointment, please call your pharmacy.

## 2015-05-25 NOTE — Progress Notes (Signed)
Subjective:    Patient ID: Michelle Nichols, female    DOB: [birth year], 55 y.o.   MRN: [id number]  HPI She is here for a 3 month follow up.  Prediabetes: She is taking her medication daily as prescribed. If she does not take the metformin her sugar is high at 157 ish.  She is fairly compliant with a diabetic diet. She is exercising irregularly. She monitors her sugars and they have been running 120-130 typically.   Hypertension: She is taking her medication daily. She is compliant with a low sodium diet.  She has had some chest pain, sob and intermittent edema.  She sees cardiology today and has an appointment with pulmonary.  She is exercising irregularly.  She does not monitor her blood pressure at home.    Hyperlipidemia: She is taking her medication daily. She is compliant with a low fat/cholesterol diet. She is exercising irregularly.   Bilateral foot pain;  She has one month of pain in her feet.  The pain is from the arch to the heel on the bottom of the foot.  It starts hurting at night and goes away in the morning.  It hurts if she walks a lot.    Medications and allergies reviewed with patient and updated if appropriate.  Patient Active Problem List   Diagnosis Date Noted  . Carotid artery disease ([place]) 03/03/2015  . Peripheral vascular disease ([place]) 03/03/2015  . Atherosclerosis 03/03/2015  . Uterine fibroid   . Gastroesophageal reflux disease without esophagitis   . Essential (primary) hypertension   . Mild persistent asthma in adult without complication   . Hx of stroke without residual deficits   . Intractable migraine with status migrainosus   . Chest pain   . 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 Wheezing   . SOB (shortness of breath)   . Seizure ([place])   . Lightheadedness   . Prediabetes 02/11/2015  . Chronic kidney disease, stage III (moderate) 02/10/2015  . Insomnia 02/10/2015  . Depression 02/10/2015  . Anxiety state 02/10/2015  . Asthma 02/10/2015  . Essential  hypertension 02/10/2015  . Migraines 02/10/2015  . 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 Hyperglycemia 02/10/2015    Current Outpatient Prescriptions on File Prior to Visit  Medication Sig Dispense Refill  . albuterol (PROVENTIL HFA;VENTOLIN HFA) 108 (90 BASE) MCG/ACT inhaler Inhale into the lungs every 6 (six) hours as needed for wheezing or shortness of breath.    Marland Kitchen aspirin EC 81 MG tablet Take 1 tablet (81 mg total) by mouth daily.    Marland Kitchen atorvastatin (LIPITOR) 40 MG tablet Take 40 mg by mouth daily.    . blood glucose meter kit and supplies KIT Use meter to check blood sugar once per day or as directed by physician. 1 each 0  . buPROPion (WELLBUTRIN XL) 300 MG 24 hr tablet Take 1 tablet (300 mg total) by mouth daily. 30 tablet 1  . Cholecalciferol (VITAMIN D3) 5000 UNITS CAPS Take by mouth.    . clopidogrel (PLAVIX) 75 MG tablet Take 1 tablet (75 mg total) by mouth daily. 90 tablet 3  . diltiazem (DILACOR XR) 120 MG 24 hr capsule Take 1 capsule (120 mg total) by mouth daily. 90 capsule 3  . Fluticasone-Salmeterol (ADVAIR) 250-50 MCG/DOSE AEPB Inhale 1 puff into the lungs 2 (two) times daily.    Marland Kitchen glucose blood test strip Use as directed to check sugars up to 4 times a day. DX E11.09 400 each 3  .  isosorbide mononitrate (IMDUR) 30 MG 24 hr tablet Take 30 mg by mouth daily.    Marland Kitchen lamoTRIgine (LAMICTAL) 25 MG tablet Take 1 tab daily for 1 week and than 2 tab daily 60 tablet 1  . Lancets (ONETOUCH ULTRASOFT) lancets Use as directed to check sugars once daily and prn 100 each 12  . metFORMIN (GLUCOPHAGE) 500 MG tablet Take 1 tablet (500 mg total) by mouth daily with supper. 90 tablet 1  . montelukast (SINGULAIR) 10 MG tablet Take 1 tablet (10 mg total) by mouth at bedtime. 30 tablet 3  . Multiple Vitamin (MULTIVITAMIN) tablet Take 1 tablet by mouth daily.    . Omega-3 Fatty Acids (OMEGA 3 PO) Take 2 tablets by mouth daily.     . traZODone (DESYREL) 50 MG tablet Take 1 tablet (50 mg total) by mouth at  bedtime. 30 tablet 1   No current facility-administered medications on file prior to visit.    Past Medical History  Diagnosis Date  . Uterine fibroid     s/p TAH  . Gastroesophageal reflux disease without esophagitis   . Chronic kidney disease, stage III (moderate)     a. Creatinine 10/16: 1.10  . Essential (primary) hypertension   . Insomnia   . Depression   . Mild persistent asthma in adult without complication   . Anxiety states   . Intractable migraine with status migrainosus   . Glucose intolerance (impaired glucose tolerance)     a. A1c 10/16: 6.4  . 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 Seizure ([place])   . Vertigo   . PAD (peripheral artery disease) ([place])     a. Bilat vascular disease dx in [place] in 2014 with stable bilat LE claudication  . Vertebral artery stenosis   . History of CVA (cerebrovascular accident)   . History of nuclear stress test     Myoview 10/16: EF 56%, low risk, normal perfusion    Past Surgical History  Procedure Laterality Date  . Partial hysterectomy  1995    for fibroids  . Colonoscopy  2014    Social History   Social History  . Marital Status: Divorced    Spouse Name: N/A  . Number of Children: 4  . 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 Smoking status: Former Smoker -- 1.50 packs/day for 20 years    Types: Cigarettes  . Smokeless tobacco: Never Used  . Alcohol Use: No  . Drug Use: No  . Sexual Activity: Not Asked   Other Topics Concern  . None   Social History Narrative   Live in companion      No regular exercise    Family History  Problem Relation Age of Onset  . Hypertension Mother   . Diabetes Mother   . Kidney disease Mother   . Depression Mother   . Kidney disease Maternal Grandfather   . Diabetes Sister   . Hypertension Sister   . Bipolar disorder Sister     Review of Systems  Constitutional: Negative for fever and chills.  HENT: Positive for  rhinorrhea.   Respiratory: Positive for cough and shortness of breath. Negative for wheezing.   Cardiovascular: Positive for chest pain (this morning - pressure in chest) and leg swelling (intermittent). Negative for palpitations.  Musculoskeletal: Positive for back pain.  Neurological: Positive for headaches (occasional). Negative for dizziness, weakness, light-headedness and numbness.  Psychiatric/Behavioral: The patient is nervous/anxious.  Objective:   Filed Vitals:   05/25/15 1010  BP: 124/80  Pulse: 59  Temp: 98.1 F (36.7 C)  Resp: 16   Filed Weights   05/25/15 1010  Weight: 242 lb (109.77 kg)   Body mass index is 40.27 kg/(m^2).   Physical Exam Constitutional: Appears well-developed and well-nourished. No distress.  Neck: Neck supple. No tracheal deviation present. No thyromegaly present.  No carotid bruit. No cervical adenopathy.   Cardiovascular: Normal rate, regular rhythm and normal heart sounds.   No murmur heard.  No edema Pulmonary/Chest: Effort normal and breath sounds normal. No respiratory distress. No wheezes.  Ext: bilateral foot pain from heel to arch      Assessment & Plan:   See Problem List for Assessment and Plan of chronic medical problems.   Follow up in 6 months

## 2015-05-26 ENCOUNTER — Other Ambulatory Visit: Payer: Self-pay | Admitting: Cardiovascular Disease

## 2015-05-26 ENCOUNTER — Encounter (HOSPITAL_COMMUNITY): Payer: Self-pay | Admitting: Psychiatry

## 2015-05-26 ENCOUNTER — Ambulatory Visit (INDEPENDENT_AMBULATORY_CARE_PROVIDER_SITE_OTHER): Payer: BLUE CROSS/BLUE SHIELD | Admitting: Psychiatry

## 2015-05-26 VITALS — BP 118/76 | HR 65 | Ht 65.0 in | Wt 243.2 lb

## 2015-05-26 DIAGNOSIS — M79605 Pain in left leg: Secondary | ICD-10-CM

## 2015-05-26 DIAGNOSIS — F4310 Post-traumatic stress disorder, unspecified: Secondary | ICD-10-CM

## 2015-05-26 DIAGNOSIS — I739 Peripheral vascular disease, unspecified: Secondary | ICD-10-CM

## 2015-05-26 DIAGNOSIS — F331 Major depressive disorder, recurrent, moderate: Secondary | ICD-10-CM | POA: Diagnosis not present

## 2015-05-26 LAB — HEPATITIS C ANTIBODY: HCV AB: NEGATIVE

## 2015-05-26 LAB — PROTIME-INR
INR: 0.96 (ref <1.50)
Prothrombin Time: 12.9 seconds (ref 11.6–15.2)

## 2015-05-26 LAB — HIV ANTIBODY (ROUTINE TESTING W REFLEX): HIV 1&2 Ab, 4th Generation: NONREACTIVE

## 2015-05-26 LAB — BASIC METABOLIC PANEL
BUN: 18 mg/dL (ref 7–25)
CHLORIDE: 103 mmol/L (ref 98–110)
CO2: 25 mmol/L (ref 20–31)
CREATININE: 1.46 mg/dL — AB (ref 0.50–1.05)
Calcium: 9.9 mg/dL (ref 8.6–10.4)
Glucose, Bld: 95 mg/dL (ref 65–99)
Potassium: 4 mmol/L (ref 3.5–5.3)
Sodium: 137 mmol/L (ref 135–146)

## 2015-05-26 LAB — APTT: aPTT: 31 seconds (ref 24–37)

## 2015-05-26 MED ORDER — LAMOTRIGINE 25 MG PO TABS: 50.0000 mg | ORAL_TABLET | Freq: Every day | ORAL | Status: AC

## 2015-05-26 MED ORDER — TRAZODONE HCL 50 MG PO TABS: 50.0000 mg | ORAL_TABLET | Freq: Every day | ORAL | Status: AC

## 2015-05-26 MED ORDER — BUPROPION HCL ER (XL) 300 MG PO TB24: 300.0000 mg | ORAL_TABLET | Freq: Every day | ORAL | Status: AC
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
## 2015-05-28 ENCOUNTER — Other Ambulatory Visit: Payer: Self-pay

## 2015-05-28 ENCOUNTER — Ambulatory Visit
Admission: RE | Admit: 2015-05-28 | Discharge: 2015-05-28 | Disposition: A | Discharge status: Home / Self Care | Payer: BLUE CROSS/BLUE SHIELD | Source: Ambulatory Visit | Attending: Cardiovascular Disease | Admitting: Cardiovascular Disease

## 2015-05-28 ENCOUNTER — Telehealth: Payer: Self-pay

## 2015-05-28 DIAGNOSIS — I10 Essential (primary) hypertension: Secondary | ICD-10-CM

## 2015-05-28 DIAGNOSIS — R0789 Other chest pain: Secondary | ICD-10-CM

## 2015-05-28 LAB — VITAMIN D 1,25 DIHYDROXY
VITAMIN D 1, 25 (OH) TOTAL: 43 pg/mL (ref 18–72)
Vitamin D2 1, 25 (OH)2: <8 pg/mL
Vitamin D3 1, 25 (OH)2: 43 pg/mL

## 2015-05-28 NOTE — Telephone Encounter (Signed)
Pt called in wanting to know if she needs stress test before her Cath on Monday 1/23 Reviewed needs with pt and she needs to get chest x ray before procedure.  Gave address to [facility] and she was going to go today before 5 pm.  Pt verbalized understanding no questions at this time.

## 2015-05-30 ENCOUNTER — Encounter: Payer: Self-pay | Admitting: Internal Medicine

## 2015-05-30 DIAGNOSIS — E119 Type 2 diabetes mellitus without complications: Secondary | ICD-10-CM | POA: Insufficient documentation

## 2015-05-31 ENCOUNTER — Encounter (HOSPITAL_COMMUNITY)
Admission: RE | Disposition: A | Discharge status: Home / Self Care | Payer: BLUE CROSS/BLUE SHIELD | Source: Ambulatory Visit | Attending: Cardiovascular Disease

## 2015-05-31 ENCOUNTER — Institutional Professional Consult (permissible substitution): Payer: Self-pay | Admitting: Internal Medicine

## 2015-05-31 ENCOUNTER — Encounter (HOSPITAL_COMMUNITY): Payer: Self-pay | Admitting: Cardiovascular Disease

## 2015-05-31 ENCOUNTER — Observation Stay (HOSPITAL_COMMUNITY)
Admission: RE | Admit: 2015-05-31 | Discharge: 2015-06-01 | Disposition: A | Discharge status: Home / Self Care | Payer: BLUE CROSS/BLUE SHIELD | Source: Ambulatory Visit | Attending: Cardiovascular Disease | Admitting: Cardiovascular Disease

## 2015-05-31 DIAGNOSIS — Z87891 Personal history of nicotine dependence: Secondary | ICD-10-CM | POA: Diagnosis not present

## 2015-05-31 DIAGNOSIS — I129 Hypertensive chronic kidney disease with stage 1 through stage 4 chronic kidney disease, or unspecified chronic kidney disease: Secondary | ICD-10-CM | POA: Diagnosis not present

## 2015-05-31 DIAGNOSIS — Z7984 Long term (current) use of oral hypoglycemic drugs: Secondary | ICD-10-CM | POA: Diagnosis not present

## 2015-05-31 DIAGNOSIS — I739 Peripheral vascular disease, unspecified: Secondary | ICD-10-CM | POA: Insufficient documentation

## 2015-05-31 DIAGNOSIS — R079 Chest pain, unspecified: Secondary | ICD-10-CM | POA: Diagnosis present

## 2015-05-31 DIAGNOSIS — Z7982 Long term (current) use of aspirin: Secondary | ICD-10-CM | POA: Diagnosis not present

## 2015-05-31 DIAGNOSIS — Z9889 Other specified postprocedural states: Secondary | ICD-10-CM

## 2015-05-31 DIAGNOSIS — R0789 Other chest pain: Secondary | ICD-10-CM | POA: Diagnosis not present

## 2015-05-31 DIAGNOSIS — I7090 Unspecified atherosclerosis: Secondary | ICD-10-CM | POA: Diagnosis present

## 2015-05-31 DIAGNOSIS — E1122 Type 2 diabetes mellitus with diabetic chronic kidney disease: Secondary | ICD-10-CM | POA: Insufficient documentation

## 2015-05-31 DIAGNOSIS — E785 Hyperlipidemia, unspecified: Secondary | ICD-10-CM | POA: Diagnosis not present

## 2015-05-31 DIAGNOSIS — N183 Chronic kidney disease, stage 3 unspecified: Secondary | ICD-10-CM | POA: Diagnosis present

## 2015-05-31 DIAGNOSIS — E119 Type 2 diabetes mellitus without complications: Secondary | ICD-10-CM

## 2015-05-31 DIAGNOSIS — I10 Essential (primary) hypertension: Secondary | ICD-10-CM | POA: Diagnosis present

## 2015-05-31 HISTORY — DX: Type 2 diabetes mellitus without complications: E11.9

## 2015-05-31 HISTORY — PX: CARDIAC CATHETERIZATION: SHX172

## 2015-05-31 LAB — GLUCOSE, CAPILLARY
GLUCOSE-CAPILLARY: 105 mg/dL — AB (ref 65–99)
GLUCOSE-CAPILLARY: 88 mg/dL (ref 65–99)
GLUCOSE-CAPILLARY: 131 mg/dL — AB (ref 65–99)
Glucose-Capillary: 98 mg/dL (ref 65–99)

## 2015-05-31 SURGERY — LEFT HEART CATH AND CORONARY ANGIOGRAPHY
Anesthesia: LOCAL

## 2015-05-31 MED ORDER — HEPARIN (PORCINE) IN NACL 2-0.9 UNIT/ML-% IJ SOLN
INTRAMUSCULAR | Status: AC
  Filled 2015-05-31: qty 1500

## 2015-05-31 MED ORDER — ASPIRIN 81 MG PO CHEW: 81.0000 mg | CHEWABLE_TABLET | Freq: Every day | ORAL | Status: DC

## 2015-05-31 MED ORDER — SODIUM CHLORIDE 0.9 % WEIGHT BASED INFUSION: 1.0000 mL/kg/h | INTRAVENOUS | Status: DC

## 2015-05-31 MED ORDER — ASPIRIN 81 MG PO CHEW
CHEWABLE_TABLET | ORAL | Status: AC
  Administered 2015-05-31: 81 mg via ORAL
  Filled 2015-05-31: qty 1

## 2015-05-31 MED ORDER — MIDAZOLAM HCL 2 MG/2ML IJ SOLN
INTRAMUSCULAR | Status: AC
  Filled 2015-05-31: qty 2

## 2015-05-31 MED ORDER — SODIUM CHLORIDE 0.9 % WEIGHT BASED INFUSION: 3.0000 mL/kg/h | INTRAVENOUS | Status: AC

## 2015-05-31 MED ORDER — NITROGLYCERIN 1 MG/10 ML FOR IR/CATH LAB
INTRA_ARTERIAL | Status: AC
  Filled 2015-05-31: qty 10

## 2015-05-31 MED ORDER — MORPHINE SULFATE (PF) 2 MG/ML IV SOLN: 2.0000 mg | INTRAVENOUS | Status: DC | PRN

## 2015-05-31 MED ORDER — ONDANSETRON HCL 4 MG/2ML IJ SOLN: 4.0000 mg | Freq: Four times a day (QID) | INTRAMUSCULAR | Status: DC | PRN

## 2015-05-31 MED ORDER — SODIUM CHLORIDE 0.9 % IJ SOLN: 3.0000 mL | INTRAMUSCULAR | Status: DC | PRN

## 2015-05-31 MED ORDER — TRAZODONE HCL 50 MG PO TABS
50.0000 mg | ORAL_TABLET | Freq: Every day | ORAL | Status: DC
  Administered 2015-05-31: 50 mg via ORAL
  Filled 2015-05-31: qty 1

## 2015-05-31 MED ORDER — FENTANYL CITRATE (PF) 100 MCG/2ML IJ SOLN
INTRAMUSCULAR | Status: AC
  Filled 2015-05-31: qty 2

## 2015-05-31 MED ORDER — DILTIAZEM HCL ER COATED BEADS 120 MG PO CP24
120.0000 mg | ORAL_CAPSULE | Freq: Every day | ORAL | Status: DC
  Administered 2015-06-01: 120 mg via ORAL
  Filled 2015-05-31 (×2): qty 1

## 2015-05-31 MED ORDER — FENTANYL CITRATE (PF) 100 MCG/2ML IJ SOLN
INTRAMUSCULAR | Status: DC | PRN
  Administered 2015-05-31 (×2): 25 ug via INTRAVENOUS

## 2015-05-31 MED ORDER — SODIUM CHLORIDE 0.9 % IV SOLN: 250.0000 mL | INTRAVENOUS | Status: DC | PRN

## 2015-05-31 MED ORDER — VERAPAMIL HCL 2.5 MG/ML IV SOLN
INTRAVENOUS | Status: AC
  Filled 2015-05-31: qty 2

## 2015-05-31 MED ORDER — ACETAMINOPHEN 325 MG PO TABS
650.0000 mg | ORAL_TABLET | ORAL | Status: DC | PRN
  Filled 2015-05-31: qty 2

## 2015-05-31 MED ORDER — NITROGLYCERIN 1 MG/10 ML FOR IR/CATH LAB
INTRA_ARTERIAL | Status: DC | PRN
  Administered 2015-05-31: 12:00:00

## 2015-05-31 MED ORDER — BUPROPION HCL ER (XL) 300 MG PO TB24
300.0000 mg | ORAL_TABLET | Freq: Every day | ORAL | Status: DC
  Administered 2015-06-01: 300 mg via ORAL
  Filled 2015-05-31: qty 1

## 2015-05-31 MED ORDER — LAMOTRIGINE 25 MG PO TABS
50.0000 mg | ORAL_TABLET | Freq: Every day | ORAL | Status: DC
  Administered 2015-06-01: 50 mg via ORAL
  Filled 2015-05-31: qty 2

## 2015-05-31 MED ORDER — MONTELUKAST SODIUM 10 MG PO TABS
10.0000 mg | ORAL_TABLET | Freq: Every day | ORAL | Status: DC
  Administered 2015-05-31: 10 mg via ORAL
  Filled 2015-05-31: qty 1

## 2015-05-31 MED ORDER — ATORVASTATIN CALCIUM 40 MG PO TABS
40.0000 mg | ORAL_TABLET | Freq: Every day | ORAL | Status: DC
  Administered 2015-05-31 – 2015-06-01 (×2): 40 mg via ORAL
  Filled 2015-05-31 (×2): qty 1

## 2015-05-31 MED ORDER — LIDOCAINE HCL (PF) 1 % IJ SOLN
INTRAMUSCULAR | Status: DC | PRN
  Administered 2015-05-31: 2 mL
  Administered 2015-05-31: 25 mL

## 2015-05-31 MED ORDER — SODIUM CHLORIDE 0.9 % IJ SOLN
3.0000 mL | Freq: Two times a day (BID) | INTRAMUSCULAR | Status: DC
  Administered 2015-06-01: 3 mL via INTRAVENOUS

## 2015-05-31 MED ORDER — MOMETASONE FURO-FORMOTEROL FUM 100-5 MCG/ACT IN AERO
2.0000 | INHALATION_SPRAY | Freq: Two times a day (BID) | RESPIRATORY_TRACT | Status: DC
  Administered 2015-05-31 – 2015-06-01 (×2): 2 via RESPIRATORY_TRACT
  Filled 2015-05-31: qty 8.8

## 2015-05-31 MED ORDER — ACETAMINOPHEN 325 MG PO TABS
650.0000 mg | ORAL_TABLET | ORAL | Status: DC | PRN
  Administered 2015-05-31: 650 mg via ORAL

## 2015-05-31 MED ORDER — SODIUM CHLORIDE 0.9 % WEIGHT BASED INFUSION
3.0000 mL/kg/h | INTRAVENOUS | Status: DC
  Administered 2015-05-31: 3 mL/kg/h via INTRAVENOUS

## 2015-05-31 MED ORDER — ASPIRIN 81 MG PO CHEW
81.0000 mg | CHEWABLE_TABLET | ORAL | Status: AC
  Administered 2015-05-31: 81 mg via ORAL

## 2015-05-31 MED ORDER — ASPIRIN EC 81 MG PO TBEC
81.0000 mg | DELAYED_RELEASE_TABLET | Freq: Every day | ORAL | Status: DC
  Administered 2015-06-01: 81 mg via ORAL
  Filled 2015-05-31: qty 1

## 2015-05-31 MED ORDER — CLOPIDOGREL BISULFATE 75 MG PO TABS
75.0000 mg | ORAL_TABLET | Freq: Every day | ORAL | Status: DC
  Administered 2015-06-01: 75 mg via ORAL
  Filled 2015-05-31: qty 1

## 2015-05-31 MED ORDER — SODIUM CHLORIDE 0.9 % IJ SOLN
3.0000 mL | Freq: Two times a day (BID) | INTRAMUSCULAR | Status: DC
  Administered 2015-05-31: 3 mL via INTRAVENOUS

## 2015-05-31 MED ORDER — IOHEXOL 350 MG/ML SOLN
INTRAVENOUS | Status: DC | PRN
Start: 2015-05-31 — End: 2015-05-31
  Administered 2015-05-31: 100 mL via INTRAVENOUS

## 2015-05-31 MED ORDER — LIDOCAINE HCL (PF) 1 % IJ SOLN
INTRAMUSCULAR | Status: AC
  Filled 2015-05-31: qty 30

## 2015-05-31 MED ORDER — SODIUM CHLORIDE 0.9 % IJ SOLN: 3.0000 mL | Freq: Two times a day (BID) | INTRAMUSCULAR | Status: DC

## 2015-05-31 MED ORDER — MIDAZOLAM HCL 2 MG/2ML IJ SOLN
INTRAMUSCULAR | Status: DC | PRN
  Administered 2015-05-31: 1 mg via INTRAVENOUS

## 2015-05-31 SURGICAL SUPPLY — 10 items
CATH INFINITI 5FR MULTPACK ANG (CATHETERS) ×2 IMPLANT
GLIDESHEATH SLEND A-KIT 6F 22G (SHEATH) ×2 IMPLANT
KIT HEART LEFT (KITS) ×2 IMPLANT
PACK CARDIAC CATHETERIZATION (CUSTOM PROCEDURE TRAY) ×2 IMPLANT
SHEATH PINNACLE 5F 10CM (SHEATH) ×2 IMPLANT
SYR MEDRAD MARK V 150ML (SYRINGE) ×2 IMPLANT
TRANSDUCER W/STOPCOCK (MISCELLANEOUS) ×2 IMPLANT
TUBING CIL FLEX 10 FLL-RA (TUBING) ×2 IMPLANT
WIRE EMERALD 3MM-J .035X150CM (WIRE) ×2 IMPLANT
WIRE HI TORQ VERSACORE-J 145CM (WIRE) ×2 IMPLANT

## 2015-05-31 NOTE — Progress Notes (Signed)
Pt states she does not have anyone to stay with her tonight.  Jennifer O'Neal,R.N. notified

## 2015-05-31 NOTE — Interval H&P Note (Signed)
Cath Lab Visit (complete for each Cath Lab visit)  Clinical Evaluation Leading to the Procedure:   ACS: No.  Non-ACS:    Anginal Classification: CCS III  Anti-ischemic medical therapy: No Therapy  Non-Invasive Test Results: No non-invasive testing performed  Prior CABG: No previous CABG      History and Physical Interval Note:  05/31/2015 11:10 AM  Michelle Nichols  has presented today for surgery, with the diagnosis of CP  The various methods of treatment have been discussed with the patient and family. After consideration of risks, benefits and other options for treatment, the patient has consented to  Procedure(s): Left Heart Cath and Coronary Angiography (N/A) as a surgical intervention .  The patient's history has been reviewed, patient examined, no change in status, stable for surgery.  I have reviewed the patient's chart and labs.  Questions were answered to the patient's satisfaction.     Quay Burow

## 2015-05-31 NOTE — Progress Notes (Signed)
Dr Gwenlyn Found notified of right pt pulse with doppler and Orlie Pollen notified and in to see client

## 2015-05-31 NOTE — H&P (View-Only) (Signed)
05/25/2015 Michelle Nichols   December 13, 1960  [id number]  Primary Physician Binnie Rail, MD Primary Cardiologist: Lorretta Harp MD Renae Gloss   HPI:  [name] is a 55 year old moderately overweight divorced African-American female mother of 21, grandmother of 7 grandchildren relocated from [place] because of her job. She has home health aide. She initially saw Richardson Dopp PAC back in October. She has a history of discontinued tobacco abuse 4 years ago as well as treated hypertension, hypokalemia and diabetes. 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 There is no family history. She has documented peripheral vascular disease with left iliac and right SFA documented disease by angiography. She does complain of left calf claudication. She has psychiatric history as well with regards to stress and bipolar disorder. She's underlying stress recently has had progressive chest pain worse today. The pain is somewhat atypical. Constant and does not radiate.   Current Outpatient Prescriptions  Medication Sig Dispense Refill  . albuterol (PROVENTIL HFA;VENTOLIN HFA) 108 (90 BASE) MCG/ACT inhaler Inhale into the lungs every 6 (six) hours as needed for wheezing or shortness of breath.    Marland Kitchen aspirin EC 81 MG tablet Take 1 tablet (81 mg total) by mouth daily.    Marland Kitchen atorvastatin (LIPITOR) 40 MG tablet Take 40 mg by mouth daily.    . blood glucose meter kit and supplies KIT Use meter to check blood sugar once per day or as directed by physician. 1 each 0  . buPROPion (WELLBUTRIN XL) 300 MG 24 hr tablet Take 1 tablet (300 mg total) by mouth daily. 30 tablet 1  . Cholecalciferol (VITAMIN D3) 5000 UNITS CAPS Take by mouth.    . clopidogrel (PLAVIX) 75 MG tablet Take 1 tablet (75 mg total) by mouth daily. 90 tablet 3  . diltiazem (DILACOR XR) 120 MG 24 hr capsule Take 1 capsule (120 mg total) by mouth daily. 90 capsule 3  . Fluticasone-Salmeterol  (ADVAIR) 250-50 MCG/DOSE AEPB Inhale 1 puff into the lungs 2 (two) times daily.    Marland Kitchen glucose blood test strip Use as directed to check sugars up to 4 times a day. DX E11.09 400 each 3  . isosorbide mononitrate (IMDUR) 30 MG 24 hr tablet Take 30 mg by mouth daily.    Marland Kitchen lamoTRIgine (LAMICTAL) 25 MG tablet Take 1 tab daily for 1 week and than 2 tab daily 60 tablet 1  . Lancets (ONETOUCH ULTRASOFT) lancets Use as directed to check sugars once daily and prn 100 each 12  . metFORMIN (GLUCOPHAGE) 500 MG tablet Take 1 tablet (500 mg total) by mouth daily with supper. 90 tablet 1  . montelukast (SINGULAIR) 10 MG tablet Take 1 tablet (10 mg total) by mouth at bedtime. 30 tablet 3  . Multiple Vitamin (MULTIVITAMIN) tablet Take 1 tablet by mouth daily.    . Omega-3 Fatty Acids (OMEGA 3 PO) Take 2 tablets by mouth daily.     . traZODone (DESYREL) 50 MG tablet Take 1 tablet (50 mg total) by mouth at bedtime. 30 tablet 1   No current facility-administered medications for this visit.    No Known Allergies  Social History   Social History  . Marital Status: Divorced    Spouse Name: N/A  . Number of Children: 4  . 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 Smoking  status: Former Smoker -- 1.50 packs/day for 20 years    Types: Cigarettes  . Smokeless tobacco: Never Used  . Alcohol Use: No  . Drug Use: No  . Sexual Activity: Not on file   Other Topics Concern  . Not on file   Social History Narrative   Live in companion      No regular exercise     Review of Systems: General: negative for chills, fever, night sweats or weight changes.  Cardiovascular: negative for chest pain, dyspnea on exertion, edema, orthopnea, palpitations, paroxysmal nocturnal dyspnea or shortness of breath Dermatological: negative for rash Respiratory: negative for cough or wheezing Urologic: negative for hematuria Abdominal: negative for nausea, vomiting,  diarrhea, bright red blood per rectum, melena, or hematemesis Neurologic: negative for visual changes, syncope, or dizziness All other systems reviewed and are otherwise negative except as noted above.    Blood pressure 120/60, pulse 61, height _0  (1.651 m), weight 242 lb 1.6 oz (109.816 kg).  General appearance: alert and no distress Neck: no adenopathy, no carotid bruit, no JVD, supple, symmetrical, trachea midline and thyroid not enlarged, symmetric, no tenderness/mass/nodules Lungs: clear to auscultation bilaterally Heart: regular rate and rhythm, S1, S2 normal, no murmur, click, rub or gallop Extremities: extremities normal, atraumatic, no cyanosis or edema  EKG sinus rhythm of 61 with nonspecific ST or T-wave changes. I personally reviewed this EKG  ASSESSMENT AND PLAN:   Peripheral vascular disease ([place]) History of peripheral arterial disease with previous documented left iliac and right SFA disease. She does complain of left calf claudication. We will get lower extremity arterial Doppler studies.  Chest pain Mrs. Kea has been complaining of chest pain for several weeks worse today. She was seen by Richardson Dopp as a new patient evaluation 02/16/15. Subsequent Myoview performed one week later was completely normal. I've offered her transfer to the hospital today by EMS but she has declined. She would rather pursue outpatient clinic catheterization which I will arrange as soon as possible. Her pain sounds somewhat atypical. Her EKG shows no acute changes.  I have reviewed the risks, indications, and alternatives to cardiac catheterization, possible angioplasty, and stenting with the patient. Risks include but are not limited to bleeding, infection, vascular injury, stroke, myocardial infection, arrhythmia, kidney injury, radiation-related injury in the case of prolonged fluoroscopy use, emergency cardiac surgery, and death. The patient understands the risks of serious complication is  1-2 in 1643 with diagnostic cardiac cath and 1-2% or less with angioplasty/stenting.   Essential hypertension History of hypertension blood pressure measures 120/60. She is on diltiazem. Continue current meds at current dosing  Hyperlipidemia History of hyperlipidemia on statin therapy.      Lorretta Harp MD FACP,FACC,FAHA, [facility] 05/25/2015 3:04 PM

## 2015-05-31 NOTE — Progress Notes (Signed)
Right femoral artery sheath removed and pressure held for 20 minutes. Groin level zero, right DP pulse palpable. Bed rest starts at 12:30 for 4 hours. Patient verbalizes understanding of post cath instructions.

## 2015-06-01 ENCOUNTER — Ambulatory Visit (HOSPITAL_COMMUNITY): Payer: Self-pay | Admitting: Psychology

## 2015-06-01 ENCOUNTER — Encounter (HOSPITAL_COMMUNITY): Payer: Self-pay | Admitting: General Practice

## 2015-06-01 DIAGNOSIS — R0789 Other chest pain: Secondary | ICD-10-CM | POA: Diagnosis not present

## 2015-06-01 DIAGNOSIS — N183 Chronic kidney disease, stage 3 (moderate): Secondary | ICD-10-CM

## 2015-06-01 DIAGNOSIS — I10 Essential (primary) hypertension: Secondary | ICD-10-CM | POA: Diagnosis not present

## 2015-06-01 DIAGNOSIS — Z9889 Other specified postprocedural states: Secondary | ICD-10-CM | POA: Diagnosis not present

## 2015-06-01 LAB — BASIC METABOLIC PANEL
ANION GAP: 7 (ref 5–15)
BUN: 14 mg/dL (ref 6–20)
CALCIUM: 9.1 mg/dL (ref 8.9–10.3)
CO2: 24 mmol/L (ref 22–32)
CREATININE: 1.27 mg/dL — AB (ref 0.44–1.00)
Chloride: 107 mmol/L (ref 101–111)
GFR, EST AFRICAN AMERICAN: 55 mL/min — AB (ref >60)
GFR, EST NON AFRICAN AMERICAN: 47 mL/min — AB (ref >60)
Glucose, Bld: 111 mg/dL — ABNORMAL HIGH (ref 65–99)
Potassium: 3.9 mmol/L (ref 3.5–5.1)
SODIUM: 138 mmol/L (ref 135–145)

## 2015-06-01 LAB — GLUCOSE, CAPILLARY
GLUCOSE-CAPILLARY: 109 mg/dL — AB (ref 65–99)
GLUCOSE-CAPILLARY: 92 mg/dL (ref 65–99)

## 2015-06-01 LAB — CBC
HEMATOCRIT: 37 % (ref 36.0–46.0)
Hemoglobin: 12.3 g/dL (ref 12.0–15.0)
MCH: 28.7 pg (ref 26.0–34.0)
MCHC: 33.2 g/dL (ref 30.0–36.0)
MCV: 86.2 fL (ref 78.0–100.0)
PLATELETS: 273 10*3/uL (ref 150–400)
RBC: 4.29 MIL/uL (ref 3.87–5.11)
RDW: 14.6 % (ref 11.5–15.5)
WBC: 5.8 10*3/uL (ref 4.0–10.5)

## 2015-06-01 MED FILL — Verapamil HCl IV Soln 2.5 MG/ML: INTRAVENOUS | Qty: 2 | Status: AC

## 2015-06-01 NOTE — Discharge Summary (Signed)
Discharge Summary    Patient ID: Rashia Mckesson,  MRN: [id number], DOB/AGE: 1960/07/19 55 y.o.  Admit date: 05/31/2015 Discharge date: 06/01/2015  Primary Care Provider: Binnie Rail Primary Cardiologist: Dr. Gwenlyn Found  Discharge Diagnoses    Principal Problem:   Atypical chest pain Active Problems:   Chronic kidney disease, stage III (moderate)   Essential hypertension   Essential (primary) hypertension   Atherosclerosis   Diabetes (HCC)   S/P left heart catheterization by percutaneous approach   Allergies No Known Allergies  Diagnostic Studies/Procedures    Cardiac cath 05/31/2015  IMPRESSION:Mrs. Krahenbuhl normal coronary arteries and normal LV function. I believe her chest pain is noncardiac.The sheath was removed and pressure held to achieve hemostasis. The patient left the lab in stable Condition. She will be discharged home today as an outpatient and will followup with a mid-level provider. 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 She has home health aide. She initially saw Richardson Dopp PAC back in October. She has a history of discontinued tobacco abuse 4 years ago as well as treated hypertension, hypokalemia and diabetes. 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 There is no family history. She has documented peripheral vascular disease with left iliac and right SFA documented disease by angiography. She does complain of left calf claudication. She has psychiatric history as well with regards to stress and bipolar disorder. She's underlying stress recently has had progressive chest pain worse today. The pain is somewhat atypical. Constant and does not radiate.  Hospital Course     She underwent outpatient cardiac catheterization on 05/31/2015 which  showed normal coronaries, normal EF. Her chest pain was felt to be noncardiac in nature. She was kept overnight for observation. He was seen in the morning of 06/01/2015, at which time she denies any significant chest discomfort. She is deemed stable for discharge from cardiology perspective. I have given her a work note to go back to work tomorrow however no lifting greater than 5 pounds for one week. I have also instructed the patient to hold metformin for 48 hours post cath. I have arranged outpatient follow-up with Dr. Kennon Holter office.  _____________  Discharge Vitals Blood pressure 158/88, pulse 53, temperature 98 F (36.7 C), temperature source Oral, resp. rate 19, height _0  (1.651 m), weight 241 lb 12.8 oz (109.68 kg), SpO2 96 %.  Filed Weights   05/31/15 1009 05/31/15 1726  Weight: 238 lb (107.956 kg) 241 lb 12.8 oz (109.68 kg)    Labs & Radiologic Studies     CBC  Recent Labs  06/01/15 0429  WBC 5.8  HGB 12.3  HCT 37.0  MCV 86.2  PLT 459   Basic Metabolic Panel  Recent Labs  06/01/15 0429  NA 138  K 3.9  CL 107  CO2 24  GLUCOSE 111*  BUN 14  CREATININE 1.27*  CALCIUM 9.1    Dg Chest 2 View  05/28/2015  CLINICAL DATA:  Chest pain. EXAM: CHEST  2 VIEW COMPARISON:  No prior . FINDINGS: Mediastinum hilar structures normal. Mild cardiomegaly. No pulmonary venous congestion. Basilar subsegmental atelectasis. No pleural effusion or pneumothorax. No acute bony abnormality. IMPRESSION: 1. Mild cardiomegaly.  No pulmonary venous congestion. 2. Bibasilar subsegmental atelectasis. Electronically Signed   By: Marcello Moores  Register   On: 05/28/2015 17:13    Disposition   Pt is being discharged home today in good condition.  Follow-up Plans & Appointments    Follow-up Information    Follow up with HAGER, BRYAN, PA-C On 06/30/2015.   Specialties:  Physician Assistant, Radiology, Interventional Cardiology   Why:  10:00AM. Cardiology followup.    Contact information:   [address] [place] [id number] [phone]       Follow up with Binnie Rail, MD.   Specialty:  Internal Medicine   Why:  continue to followup with your primary care provider   Contact information:   [address] [place] [address] [phone]      Discharge Instructions    Diet - low sodium heart healthy    Complete by:  As directed      Discharge instructions    Complete by:  As directed   No lifting over 5 lbs for 1 week. No sexual activity for 1 week. Keep procedure site clean & dry. If you notice increased pain, swelling, bleeding or pus, call/return!  You may shower, but no soaking baths/hot tubs/pools for 1 week.   Hold metformin for 48 hours after contrast dye, you can restart Metformin on 06/03/2015     Increase activity slowly    Complete by:  As directed            Discharge Medications   Discharge Medication List as of 06/01/2015 12:42 PM    CONTINUE these medications which have NOT CHANGED   Details  albuterol (PROVENTIL HFA;VENTOLIN HFA) 108 (90 BASE) MCG/ACT inhaler Inhale into the lungs every 6 (six) hours as needed for wheezing or shortness of breath., Until Discontinued, Historical Med    aspirin EC 81 MG tablet Take 1 tablet (81 mg total) by mouth daily., Starting 02/16/2015, Until Discontinued, OTC    atorvastatin (LIPITOR) 40 MG tablet Take 40 mg by mouth daily., Until Discontinued, Historical Med    blood glucose meter kit and supplies KIT Use meter to check blood sugar once per day or as directed by physician., Normal    buPROPion (WELLBUTRIN XL) 300 MG 24 hr tablet Take 1 tablet (300 mg total) by mouth daily., Starting 05/26/2015, Until Discontinued, Normal    Cholecalciferol (VITAMIN D3) 5000 UNITS CAPS Take 1 capsule by mouth daily. , Until Discontinued, Historical Med    clopidogrel (PLAVIX) 75 MG tablet Take 1 tablet (75 mg total) by mouth daily., Starting 02/18/2015, Until Discontinued, Normal    diltiazem (DILACOR XR) 120  MG 24 hr capsule Take 1 capsule (120 mg total) by mouth daily., Starting 02/18/2015, Until Discontinued, Normal    Fluticasone-Salmeterol (ADVAIR) 250-50 MCG/DOSE AEPB Inhale 1 puff into the lungs 2 (two) times daily., Until Discontinued, Historical Med    glucose blood test strip Use as directed to check sugars up to 4 times a day. DX E11.09, Normal    isosorbide mononitrate (IMDUR) 30 MG 24 hr tablet Take 30 mg by mouth daily., Until Discontinued, Historical Med    lamoTRIgine (LAMICTAL) 25 MG tablet Take 2 tablets (50 mg total) by mouth daily., Starting 05/26/2015, Until Discontinued, Normal    Lancets (ONETOUCH ULTRASOFT) lancets Use as directed to check sugars once daily and prn, Normal    metFORMIN (GLUCOPHAGE) 500 MG tablet Take 1 tablet (500 mg total) by mouth daily with supper., Starting 03/18/2015, Until Discontinued, Normal    montelukast (SINGULAIR) 10 MG tablet Take 1 tablet (10 mg  total) by mouth at bedtime., Starting 02/10/2015, Until Discontinued, Normal    Multiple Vitamin (MULTIVITAMIN) tablet Take 1 tablet by mouth daily., Until Discontinued, Historical Med    Omega-3 Fatty Acids (OMEGA 3 PO) Take 2 tablets by mouth daily. , Until Discontinued, Historical Med    traZODone (DESYREL) 50 MG tablet Take 1 tablet (50 mg total) by mouth at bedtime., Starting 05/26/2015, Until Discontinued, Normal         Aspirin prescribed at discharge?  Yes High Intensity Statin Prescribed? (Lipitor 40-73m or Crestor 20-432m: Yes ADP Receptor Inhibitor Prescribed? (i.e. Plavix etc.-Includes Medically Managed Patients): Yes   Duration of Discharge Encounter   Greater than 30 minutes including physician time.  SiHilbert CorriganA-C 06/01/2015, 8:01 PM    Personally seen and examined. Agree with above. OK for DC home No CAD Reassuring Continue to work on weight loss and BP.   SKCandee FurbishMD

## 2015-06-01 NOTE — Discharge Instructions (Signed)
Do not restart metformin until Thursday 06-03-2015  Angiogram, Care After These instructions give you information about caring for yourself after your procedure. Your doctor may also give you more specific instructions. Call your doctor if you have any problems or questions after your procedure.  HOME CARE  Take medicines only as told by your doctor.  Follow your doctor's instructions about:  Care of the area where the tube was inserted.  Bandage (dressing) changes and removal.  You may shower 24-48 hours after the procedure or as told by your doctor.  Do not take baths, swim, or use a hot tub until your doctor approves.  Every day, check the area where the tube was inserted. Watch for:  Redness, swelling, or pain.  Fluid, blood, or pus.  Do not apply powder or lotion to the site.  Do not lift anything that is heavier than 10 lb (4.5 kg) for 5 days or as told by your doctor.  Ask your doctor when you can:  Return to work or school.  Do physical activities or play sports.  Have sex.  Do not drive or operate heavy machinery for 24 hours or as told by your doctor.  Have someone with you for the first 24 hours after the procedure.  Keep all follow-up visits as told by your doctor. This is important. GET HELP IF:  You have a fever.   You have chills.   You have more bleeding from the area where the tube was inserted. Hold pressure on the area.  You have redness, swelling, or pain in the area where the tube was inserted.  You have fluid or pus coming from the area. GET HELP RIGHT AWAY IF:   You have a lot of pain in the area where the tube was inserted.  The area where the tube was inserted is bleeding, and the bleeding does not stop after 30 minutes of holding steady pressure on the area.  The area near or just beyond the insertion site becomes pale, cool, tingly, or numb.   This information is not intended to replace advice given to you by your health care  provider. Make sure you discuss any questions you have with your health care provider.   Document Released: 07/21/2008 Document Revised: 05/15/2014 Document Reviewed: 09/25/2012 Elsevier Interactive Patient Education 2016 [employer].      No lifting over 5 lbs for 1 week. No sexual activity for 1 week. Keep procedure site clean & dry. If you notice increased pain, swelling, bleeding or pus, call/return!  You may shower, but no soaking baths/hot tubs/pools for 1 week.   Hold metformin for 48 hours after contrast dye, you can restart Metformin on 06/03/2015

## 2015-06-01 NOTE — Progress Notes (Signed)
Patient Name: Michelle Nichols Date of Encounter: 06/01/2015  Primary Cardiologist: Dr. Gwenlyn Found   Principal Problem:   Atypical chest pain Active Problems:   Chronic kidney disease, stage III (moderate)   Essential hypertension   Essential (primary) hypertension   Atherosclerosis   Diabetes ([place])   S/P left heart catheterization by percutaneous approach    SUBJECTIVE  Denies any CP or SOB.   CURRENT MEDS . aspirin EC  81 mg Oral Daily  . atorvastatin  40 mg Oral Daily  . buPROPion  300 mg Oral Daily  . clopidogrel  75 mg Oral Daily  . diltiazem  120 mg Oral Daily  . lamoTRIgine  50 mg Oral Daily  . mometasone-formoterol  2 puff Inhalation BID  . montelukast  10 mg Oral QHS  . sodium chloride  3 mL Intravenous Q12H  . sodium chloride  3 mL Intravenous Q12H  . sodium chloride  3 mL Intravenous Q12H  . traZODone  50 mg Oral QHS    OBJECTIVE  Filed Vitals:   05/31/15 1955 05/31/15 2148 06/01/15 0625 06/01/15 0741  BP:  144/89 156/90   Pulse: 83 54 53   Temp:  97.6 F (36.4 C) 97.7 F (36.5 C)   TempSrc:  Oral Oral   Resp: 16 18 17    Height:      Weight:      SpO2:  100% 100% 99%    Intake/Output Summary (Last 24 hours) at 06/01/15 0844 Last data filed at 05/31/15 1900  Gross per 24 hour  Intake    240 ml  Output      0 ml  Net    240 ml   Filed Weights   05/31/15 1009 05/31/15 1726  Weight: 238 lb (107.956 kg) 241 lb 12.8 oz (109.68 kg)    PHYSICAL EXAM  General: Pleasant, NAD. Neuro: Alert and oriented X 3. Moves all extremities spontaneously. Psych: Normal affect. HEENT:  Normal  Neck: Supple without bruits or JVD. Lungs:  Resp regular and unlabored, CTA. Heart: RRR no s3, s4, or murmurs. R femoral cath site stable, no bleeding or significant hematoma Abdomen: Soft, non-tender, non-distended, BS + x 4.  Extremities: No clubbing, cyanosis or edema. DP/PT/Radials 2+ and equal bilaterally.  Accessory Clinical Findings  CBC  Recent Labs  06/01/15 0429  WBC 5.8  HGB 12.3  HCT 37.0  MCV 86.2  PLT 123456   Basic Metabolic Panel  Recent Labs  06/01/15 0429  NA 138  K 3.9  CL 107  CO2 24  GLUCOSE 111*  BUN 14  CREATININE 1.27*  CALCIUM 9.1    TELE NSR without significant ventricular ectopy    ECG  No new EKG   Radiology/Studies  Dg Chest 2 View  05/28/2015  CLINICAL DATA:  Chest pain. EXAM: CHEST  2 VIEW COMPARISON:  No prior . FINDINGS: Mediastinum hilar structures normal. Mild cardiomegaly. No pulmonary venous congestion. Basilar subsegmental atelectasis. No pleural effusion or pneumothorax. No acute bony abnormality. IMPRESSION: 1. Mild cardiomegaly.  No pulmonary venous congestion. 2. Bibasilar subsegmental atelectasis. Electronically Signed   By: Marcello Moores  Register   On: 05/28/2015 17:13    ASSESSMENT AND PLAN  55 yo AA female with HTN, HLD, DM, h/o vertebral stroke and PVD presented with intermittent chest pain despite recent negative myoview on 02/25/2015   1. Atypical chest pain despite recent negative myoview  - cath 05/31/2015 normal coronaries, normal LVEF, noncardiac chest pain.   - continue ASA, lipitor, plavix and diltiazem.  Discharge today. Will need 1 week work note no lifting > 5 lbs since she does a lot of lifting at work  2. HTN  3. HLD 4. DM II 5. H/o PVD   Signed, Almyra Deforest PA-C Pager: [phone]  Personally seen and examined. Agree with above. OK for DC home No CAD Reassuring Continue to work on weight loss and BP.   Candee Furbish, MD

## 2015-06-02 ENCOUNTER — Encounter: Payer: Self-pay | Admitting: Internal Medicine

## 2015-06-03 ENCOUNTER — Encounter: Payer: Self-pay | Admitting: Internal Medicine

## 2015-06-03 MED ORDER — MOMETASONE FURO-FORMOTEROL FUM 100-5 MCG/ACT IN AERO: 2.0000 | INHALATION_SPRAY | Freq: Two times a day (BID) | RESPIRATORY_TRACT | Status: DC

## 2015-06-04 ENCOUNTER — Telehealth: Payer: Self-pay | Admitting: *Deleted

## 2015-06-04 ENCOUNTER — Telehealth: Payer: Self-pay | Admitting: Internal Medicine

## 2015-06-04 MED ORDER — IPRATROPIUM-ALBUTEROL 18-103 MCG/ACT IN AERO: 1.0000 | INHALATION_SPRAY | RESPIRATORY_TRACT | Status: DC | PRN

## 2015-06-04 NOTE — Telephone Encounter (Signed)
Error

## 2015-06-04 NOTE — Telephone Encounter (Signed)
We can try combivent - sent to pharmacy

## 2015-06-04 NOTE — Telephone Encounter (Signed)
Receive call pt states the albuterol inhaler that she is taking is not working for her at all. She can only take the [facility] twice a day, she had a asthma attack yesterday. Wanting to know is there anything else she can take to replace the albuterol until she get in to see the lung doctor...Johny Chess

## 2015-06-04 NOTE — Telephone Encounter (Signed)
Pharmacist called to advise that albuterol-ipratropium (COMBIVENT) 18-103 MCG/ACT inhaler [id number] no longer exists in this way. Please give them a call to correct medication.

## 2015-06-04 NOTE — Telephone Encounter (Signed)
Called pharm to verify. Stated that The inhaler would have to be Albuterol 20- ipratropium 100 MCG/ACT (combivent) RX has been changed.

## 2015-06-04 NOTE — Telephone Encounter (Signed)
Notified pt md sent new inhaler to pharmacy...Michelle Nichols

## 2015-06-08 ENCOUNTER — Ambulatory Visit (HOSPITAL_COMMUNITY)
Admission: RE | Admit: 2015-06-08 | Discharge: 2015-06-08 | Disposition: A | Discharge status: Home / Self Care | Payer: BLUE CROSS/BLUE SHIELD | Source: Ambulatory Visit | Attending: Cardiovascular Disease | Admitting: Cardiovascular Disease

## 2015-06-08 ENCOUNTER — Other Ambulatory Visit: Payer: Self-pay | Admitting: Cardiovascular Disease

## 2015-06-08 DIAGNOSIS — E1122 Type 2 diabetes mellitus with diabetic chronic kidney disease: Secondary | ICD-10-CM | POA: Diagnosis not present

## 2015-06-08 DIAGNOSIS — I129 Hypertensive chronic kidney disease with stage 1 through stage 4 chronic kidney disease, or unspecified chronic kidney disease: Secondary | ICD-10-CM | POA: Diagnosis not present

## 2015-06-08 DIAGNOSIS — M79604 Pain in right leg: Secondary | ICD-10-CM | POA: Diagnosis not present

## 2015-06-08 DIAGNOSIS — M79605 Pain in left leg: Secondary | ICD-10-CM | POA: Diagnosis not present

## 2015-06-08 DIAGNOSIS — I10 Essential (primary) hypertension: Secondary | ICD-10-CM

## 2015-06-08 DIAGNOSIS — I708 Atherosclerosis of other arteries: Secondary | ICD-10-CM | POA: Diagnosis not present

## 2015-06-08 DIAGNOSIS — I739 Peripheral vascular disease, unspecified: Secondary | ICD-10-CM | POA: Insufficient documentation

## 2015-06-08 DIAGNOSIS — I771 Stricture of artery: Secondary | ICD-10-CM | POA: Insufficient documentation

## 2015-06-08 DIAGNOSIS — N183 Chronic kidney disease, stage 3 (moderate): Secondary | ICD-10-CM | POA: Diagnosis not present

## 2015-06-08 DIAGNOSIS — M79606 Pain in leg, unspecified: Secondary | ICD-10-CM | POA: Diagnosis present

## 2015-06-17 ENCOUNTER — Encounter: Payer: Self-pay | Admitting: Internal Medicine

## 2015-06-17 DIAGNOSIS — J4530 Mild persistent asthma, uncomplicated: Secondary | ICD-10-CM

## 2015-06-20 NOTE — Addendum Note (Signed)
Addended by: Mauricio Po D on: 06/20/2015 08:56 PM   Modules accepted: Orders

## 2015-06-25 ENCOUNTER — Ambulatory Visit (INDEPENDENT_AMBULATORY_CARE_PROVIDER_SITE_OTHER): Payer: BLUE CROSS/BLUE SHIELD | Admitting: Internal Medicine

## 2015-06-25 ENCOUNTER — Encounter: Payer: Self-pay | Admitting: Internal Medicine

## 2015-06-25 VITALS — BP 128/82 | HR 68 | Ht 65.0 in | Wt 244.0 lb

## 2015-06-25 DIAGNOSIS — R0600 Dyspnea, unspecified: Secondary | ICD-10-CM | POA: Diagnosis not present

## 2015-06-25 DIAGNOSIS — R05 Cough: Secondary | ICD-10-CM

## 2015-06-25 DIAGNOSIS — R058 Other specified cough: Secondary | ICD-10-CM

## 2015-06-25 DIAGNOSIS — R059 Cough, unspecified: Secondary | ICD-10-CM

## 2015-06-25 LAB — NITRIC OXIDE: NITRIC OXIDE: 7

## 2015-06-25 MED ORDER — PANTOPRAZOLE SODIUM 40 MG PO TBEC: 40.0000 mg | DELAYED_RELEASE_TABLET | Freq: Every day | ORAL | Status: AC

## 2015-06-25 MED ORDER — FAMOTIDINE 20 MG PO TABS: ORAL_TABLET | ORAL | Status: AC

## 2015-06-25 NOTE — Patient Instructions (Addendum)
Pantoprazole (protonix) 40 mg   Take  30-60 min before first meal of the day and Pepcid (famotidine)  20 mg one @  bedtime until return to office - this is the best way to tell whether stomach acid is contributing to your problem.    GERD (REFLUX)  is an extremely common cause of respiratory symptoms just like yours , many times with no obvious heartburn at all.    It can be treated with medication, but also with lifestyle changes including elevation of the head of your bed (ideally with 6 inch  bed blocks),  Smoking cessation, avoidance of late meals, excessive alcohol, and avoid fatty foods, chocolate, peppermint, colas, red wine, and acidic juices such as orange juice.  NO MINT OR MENTHOL PRODUCTS SO NO COUGH DROPS  USE SUGARLESS CANDY INSTEAD (Jolley ranchers or Stover's or Life Savers) or even ice chips will also do - the key is to swallow to prevent all throat clearing. NO OIL BASED VITAMINS - use powdered substitutes - stop the fish oil and eat more fish  Continue Dulera 100 Take 2 puffs first thing in am and then another 2 puffs about 12 hours later.   Work on maintaining perfect  inhaler technique:  relax and gently blow all the way out then take a nice smooth deep breath back in, triggering the inhaler at same time you start breathing in.  Hold for up to 5 seconds if you can. Blow out thru nose. Rinse and gargle with water when done      Keep appt with Dr Edison Nasuti and we'll see you as needed

## 2015-06-25 NOTE — Progress Notes (Signed)
Subjective:    Patient ID: Michelle Nichols, female    DOB: Nov 07, 1960       MRN: [id number]  HPI  41 yobf nurse's aid quit smoking 2012 h/o sob doing gym at age 55 while living in [place] and not aerobically active since then but not limiting sob  then age 45 sob dx as  Asthma by [place] doctorr, no better on pred, tendency to flares and saba dep and failed advair moved to [facility] July 2016  referred to pulmonary clinic 06/25/2015 by Dr Billey Gosling p admit with atypical cp   Admit date: 05/31/2015 Discharge date: 06/01/2015  Primary Care Provider: Binnie Rail Primary Cardiologist: Dr. Gwenlyn Found  Discharge Diagnoses   Principal Problem:  Atypical chest pain Active Problems:  Chronic kidney disease, stage III (moderate)  Essential hypertension  Essential (primary) hypertension  Atherosclerosis  Diabetes ([place])  S/P left heart catheterization by percutaneous approach       06/25/2015 1st [facility] office visit/ Iley Deignan   Chief Complaint  Patient presents with  . PULMONARY CONSULT    Per Dr. Billey Gosling. Pt c/o recent episodes of chest pains accompanied by SOB. Pt was was cleared by cardiology, but the SOB continues. 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 This is when she feels the symptoms the most. Pt is on Dulera 2 puffs BID and feels that is not controlling her symptoms. Pt has not picked up the albuterol HFA.   very evasive re use of saba and whether dulera really helping or not  Variably waking with sob but no real change on or off albuterol or dulera in terms  Of freq of wakening due to perceived sob/ chest tightness or cough   Variable day dry  cough/ atypical cp noted with neg cards w/u  Worse breathing on exp to cats/dogs > [place] eval pending for 07/05/15   No obvious  patterns in day to day or daytime variabilty or assoc excess/ purulent sputum or mucus plugs  or   subjective wheeze overt sinus or hb symptoms. No unusual exp hx or h/o childhood  pna/ asthma or knowledge of premature birth.  .Also denies any obvious fluctuation of symptoms with weather or environmental changes (other than pet exp)  or other aggravating or alleviating factors except as outlined above   Current Medications, Allergies, Complete Past Medical History, Past Surgical History, Family History, and Social History were reviewed in [employer] record.            Review of Systems  Constitutional: Negative.  Negative for fever and unexpected weight change.  HENT: Positive for congestion, ear pain, postnasal drip, rhinorrhea and sinus pressure. Negative for dental problem, nosebleeds, sneezing, sore throat and trouble swallowing.   Eyes: Positive for redness and itching.  Respiratory: Positive for shortness of breath and wheezing. Negative for cough and chest tightness.   Cardiovascular: Negative.  Negative for palpitations and leg swelling.  Gastrointestinal: Negative.  Negative for nausea and vomiting.  Endocrine: Negative.   Genitourinary: Negative.  Negative for dysuria.  Musculoskeletal: Negative.  Negative for joint swelling.  Skin: Negative.  Negative for rash.  Allergic/Immunologic: Positive for environmental allergies.  Neurological: Positive for headaches.  Hematological: Bruises/bleeds easily.  Psychiatric/Behavioral: Negative.  Negative for dysphoric mood. The patient is not nervous/anxious.        Objective:   Physical Exam  Obese bf nad  Wt Readings from Last 3 Encounters:  06/25/15 244 lb (110.678 kg)  05/31/15 241 lb 12.8 oz (109.68 kg)  05/26/15 243 lb 3.2 oz (110.315 kg)    Vital signs reviewed   HEENT: nl dentition, turbinates, and oropharynx. Nl external ear canals without cough reflex   NECK :  without JVD/Nodes/TM/ nl carotid upstrokes bilaterally   LUNGS: no acc muscle use,  Nl contour chest which is clear to A and P bilaterally without cough on insp or exp maneuvers   CV:  RRR  no s3 or murmur  or increase in P2, no edema   ABD:  soft and nontender with nl inspiratory excursion in the supine position. No bruits or organomegaly, bowel sounds nl  MS:  Nl gait/ ext warm without deformities, calf tenderness, cyanosis or clubbing No obvious joint restrictions   SKIN: warm and dry without lesions    NEURO:  alert, approp, nl sensorium with  no motor deficits      I personally reviewed images and agree with radiology impression as follows:  CXR:  05/28/15 1. Mild cardiomegaly. No pulmonary venous congestion. 2. Bibasilar subsegmental atelectasis.        Assessment & Plan:

## 2015-06-26 ENCOUNTER — Encounter: Payer: Self-pay | Admitting: Internal Medicine

## 2015-06-26 DIAGNOSIS — R05 Cough: Secondary | ICD-10-CM | POA: Insufficient documentation

## 2015-06-26 DIAGNOSIS — R0600 Dyspnea, unspecified: Secondary | ICD-10-CM | POA: Insufficient documentation

## 2015-06-26 DIAGNOSIS — R058 Other specified cough: Secondary | ICD-10-CM | POA: Insufficient documentation

## 2015-06-26 NOTE — Assessment & Plan Note (Addendum)
Classic Upper airway cough syndrome, so named because it's frequently impossible to sort out how much is  CR/sinusitis with freq throat clearing (which can be related to primary GERD)   vs  causing  secondary (" extra esophageal")  GERD from wide swings in gastric pressure that occur with throat clearing, often  promoting self use of mint and menthol lozenges that reduce the lower esophageal sphincter tone and exacerbate the problem further in a cyclical fashion.   These are the same pts (now being labeled as having "irritable larynx syndrome" by some cough centers) who not infrequently have a history of having failed to tolerate ace inhibitors,  dry powder inhalers or biphosphonates or report having atypical reflux symptoms that don't respond to standard doses of PPI , and are easily confused as having aecopd or asthma flares by even experienced allergists/ pulmonologists.   rec rx for gerd first - see avs  Ok to continue dulera 100 2bid until allergy eval but I don't think this is asthma and best way to prove it would be methacholine while on GERD Rx and off dulera  06/25/2015  extensive coaching HFA effectiveness =    90%

## 2015-06-26 NOTE — Assessment & Plan Note (Signed)
Body mass index is 40.6    Lab Results  Component Value Date   TSH 1.47 02/10/2015     Contributing to gerd tendency/ doe/reviewed the need and the process to achieve and maintain neg calorie balance > defer f/u primary care including intermittently monitoring thyroid status

## 2015-06-26 NOTE — Assessment & Plan Note (Addendum)
Spirometry 06/25/2015  wnl off all rx x 24h while symptomatic  NO 06/25/2015  = 7   Symptoms are markedly disproportionate to objective findings and not clear this is a lung problem but pt does appear to have difficult airway management issues. DDX of  difficult airways management almost all start with A and  include Adherence, Ace Inhibitors, Acid Reflux, Active Sinus Disease, Alpha 1 Antitripsin deficiency, Anxiety masquerading as Airways dz,  ABPA,  Allergy(esp in young), Aspiration (esp in elderly), Adverse effects of meds,  Active smokers, A bunch of PE's (a small clot burden can't cause this syndrome unless there is already severe underlying pulm or vascular dz with poor reserve) plus two Bs  = Bronchiectasis and Beta blocker use..and one C= CHF   Adherence is always the initial "prime suspect" and is a multilayered concern that requires a "trust but verify" approach in every patient - starting with knowing how to use medications, especially inhalers, correctly, keeping up with refills and understanding the fundamental difference between maintenance and prns vs those medications only taken for a very short course and then stopped and not refilled.   ? Acid (or non-acid) GERD > always difficult to exclude as up to 75% of pts in some series report no assoc GI/ Heartburn symptoms and she has a h/o GERD and is obese > rec max (24h)  acid suppression and diet restrictions/ reviewed and instructions given in writing.   ? Anxiety > usually at the bottom of this list of usual suspects but should be much higher on this pt's based on H and P and note already on psychotropics .   ? Allergy > no evidence of allergic asthma by NO but defer more specific w/u to Dr Starling Manns and should avoid obvious triggers in meantime  I had an extended discussion with the patient reviewing all relevant studies completed to date and  lasting 45 minutes of a 80 minute visit    Each maintenance medication was reviewed in detail  including most importantly the difference between maintenance and prns and under what circumstances the prns are to be triggered using an action plan format that is not reflected in the computer generated alphabetically organized AVS.    Please see instructions for details which were reviewed in writing and the patient given a copy highlighting the part that I personally wrote and discussed at today's ov.

## 2015-06-29 ENCOUNTER — Encounter: Payer: Self-pay | Admitting: *Deleted

## 2015-06-29 ENCOUNTER — Encounter: Payer: BLUE CROSS/BLUE SHIELD | Attending: Internal Medicine | Admitting: *Deleted

## 2015-06-29 DIAGNOSIS — E119 Type 2 diabetes mellitus without complications: Secondary | ICD-10-CM

## 2015-06-29 DIAGNOSIS — Z713 Dietary counseling and surveillance: Secondary | ICD-10-CM | POA: Insufficient documentation

## 2015-06-29 DIAGNOSIS — R7303 Prediabetes: Secondary | ICD-10-CM | POA: Diagnosis present

## 2015-06-29 DIAGNOSIS — E669 Obesity, unspecified: Secondary | ICD-10-CM

## 2015-06-29 NOTE — Progress Notes (Signed)
  Medical Nutrition Therapy:  Appt start time: 1000 end time:  1030.  Assessment:  Primary concerns today: Zalea is here for follow up nutrition counseling pertaining to obesity and prediabetes.. She has made no changes to her lifestyle due to her "stress". Most recent A1c is 6.5%.   States she is eating less due to stress.  Is not seeing a therapist regularly.  Is considering moving to [place]. Thinks her asthma is flaring up and can't work well Is complaining of pain in her side that gets better with tylenol, but the medicine wears off  "since I don't eat, I drink a green drink"  Wants to walk, but complains of heal spurs.  Has been advised to get new sneakers  MEDICATIONS: see list  DIETARY INTAKE: 24 hour recall B: green drink L: spicy chicken sandwich, fries, lemonade  Recent physical activity: none, due to heal spurs  Estimated energy needs: 1800-2000 calories 200 g carbohydrates 135 g protein 50 g fat  Progress Towards Goal(s):  No progress.   Nutritional Diagnosis:  [place]in-2.1 Inpaired nutrition utilization As related to carbohydrates.  As evidenced by A1c 6.4%.    Intervention:  Nutrition counseling provided. Stressed need to practice better self-care.  Emphasized need for counseling.  Talked about exercise she could do with her heal spur (stationary bike or elliptical).  Reiterated 3 meals/day.  She states she struggles with food insecurity; gave pamphlet for free meals in [place] (patient does receive [employer] benefits).   Monitoring/Evaluation:  Dietary intake, exercise, glucose levels, and body weight in 2 month(s). Per her request

## 2015-06-30 ENCOUNTER — Ambulatory Visit (INDEPENDENT_AMBULATORY_CARE_PROVIDER_SITE_OTHER): Payer: BLUE CROSS/BLUE SHIELD | Admitting: Physician Assistant

## 2015-06-30 ENCOUNTER — Encounter: Payer: Self-pay | Admitting: Physician Assistant

## 2015-06-30 VITALS — BP 130/104 | HR 57 | Ht 65.0 in | Wt 242.0 lb

## 2015-06-30 DIAGNOSIS — I739 Peripheral vascular disease, unspecified: Secondary | ICD-10-CM

## 2015-06-30 DIAGNOSIS — I10 Essential (primary) hypertension: Secondary | ICD-10-CM

## 2015-06-30 DIAGNOSIS — Z9889 Other specified postprocedural states: Secondary | ICD-10-CM

## 2015-06-30 DIAGNOSIS — E785 Hyperlipidemia, unspecified: Secondary | ICD-10-CM | POA: Diagnosis not present

## 2015-06-30 NOTE — Patient Instructions (Signed)
Your physician wants you to follow-up in: 6 months with Dr Berry. You will receive a reminder letter in the mail two months in advance. If you don't receive a letter, please call our office to schedule the follow-up appointment.  

## 2015-06-30 NOTE — Progress Notes (Signed)
Patient ID: Michelle Nichols, female   DOB: 03-18-1961, 55 y.o.   MRN: [id number]    Date:  06/30/2015   ID:  Michelle Nichols, DOB November 02, 1960, MRN [id number]  PCP:  Binnie Rail, MD  Primary Cardiologist:  Gwenlyn Found   Chief Complaint  Patient presents with  . Follow-up    no chest pain, difficulty breathing, no swelling, no cramping, no dizziness or lightheadedness     History of Present Illness: Michelle Nichols is a 55 y.o. female  Michelle Nichols is a 55 year old obese divorced African-American female mother of 51, grandmother of 7 grandchildren relocated from [place] because of her job. She is a  home health aide. She initially saw Richardson Dopp PAC back in October. She has a history of discontinued tobacco abuse 4 years ago as well as treated hypertension, hypokalemia and diabetes. 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 There is no family history. She has documented peripheral vascular disease with left iliac and right SFA documented disease by angiography. She does complain of left calf claudication. She has psychiatric history as well with regards to stress and bipolar disorder. She's underlying stress recently has had progressive chest pain worse today. The pain is somewhat atypical. Constant and does not radiate.  She underwent outpatient cardiac catheterization on 05/31/2015 which showed normal coronaries, normal EF.      As for posthospital follow-up. She is stressed out because of her job and is now looking for something else. She is on some right-sided lumbar back pain recently which seems to be worse with movement.    The patient currently denies nausea, vomiting, fever, chest pain, shortness of breath, orthopnea, dizziness, PND, cough, congestion, abdominal pain, hematochezia, melena, lower extremity edema, claudication.  Wt Readings from Last 3 Encounters:  06/30/15 242 lb (109.77 kg)  06/25/15 244 lb (110.678 kg)  05/31/15 241 lb 12.8 oz  (109.68 kg)     Past Medical History  Diagnosis Date  . Uterine fibroid     s/p TAH  . Gastroesophageal reflux disease without esophagitis   . Chronic kidney disease, stage III (moderate)     a. Creatinine 10/16: 1.10  . Essential (primary) hypertension   . Insomnia   . Depression   . Mild persistent asthma in adult without complication   . Anxiety states   . Intractable migraine with status migrainosus   . Glucose intolerance (impaired glucose tolerance)     a. A1c 10/16: 6.4  . 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 Seizure ([place])   . Vertigo   . PAD (peripheral artery disease) ([place])     a. Bilat vascular disease dx in [place] in 2014 with stable bilat LE claudication  . Vertebral artery stenosis   . History of CVA (cerebrovascular accident)   . History of nuclear stress test     Myoview 10/16: EF 56%, low risk, normal perfusion  . Chest pain     clean coronaries on cath 05/31/2015  . Diabetes mellitus without complication ([place])     type 2    Current Outpatient Prescriptions  Medication Sig Dispense Refill  . albuterol (PROVENTIL HFA;VENTOLIN HFA) 108 (90 BASE) MCG/ACT inhaler Inhale into the lungs every 6 (six) hours as needed for wheezing or shortness of breath. Reported on 06/25/2015    . aspirin EC 81 MG tablet Take 1 tablet (81 mg total) by mouth daily.    Marland Kitchen atorvastatin (LIPITOR) 40 MG tablet Take 40  mg by mouth daily.    . blood glucose meter kit and supplies KIT Use meter to check blood sugar once per day or as directed by physician. 1 each 0  . buPROPion (WELLBUTRIN XL) 300 MG 24 hr tablet Take 1 tablet (300 mg total) by mouth daily. 30 tablet 1  . Cholecalciferol (VITAMIN D3) 5000 UNITS CAPS Take 1 capsule by mouth daily.     . clopidogrel (PLAVIX) 75 MG tablet Take 1 tablet (75 mg total) by mouth daily. 90 tablet 3  . diltiazem (DILACOR XR) 120 MG 24 hr capsule Take 1 capsule (120 mg total) by mouth daily. 90 capsule 3  . famotidine (PEPCID) 20 MG tablet One at bedtime 30 tablet  11  . glucose blood test strip Use as directed to check sugars up to 4 times a day. DX E11.09 400 each 3  . isosorbide mononitrate (IMDUR) 30 MG 24 hr tablet Take 30 mg by mouth daily.    Marland Kitchen lamoTRIgine (LAMICTAL) 25 MG tablet Take 2 tablets (50 mg total) by mouth daily. 60 tablet 1  . Lancets (ONETOUCH ULTRASOFT) lancets Use as directed to check sugars once daily and prn 100 each 12  . metFORMIN (GLUCOPHAGE) 500 MG tablet Take 1 tablet (500 mg total) by mouth daily with supper. 90 tablet 1  . mometasone-formoterol (DULERA) 100-5 MCG/ACT AERO Inhale 2 puffs into the lungs 2 (two) times daily. 1 Inhaler 5  . montelukast (SINGULAIR) 10 MG tablet Take 1 tablet (10 mg total) by mouth at bedtime. 30 tablet 3  . Multiple Vitamin (MULTIVITAMIN) tablet Take 1 tablet by mouth daily.    . pantoprazole (PROTONIX) 40 MG tablet Take 1 tablet (40 mg total) by mouth daily. Take 30-60 min before first meal of the day 30 tablet 11  . traZODone (DESYREL) 50 MG tablet Take 1 tablet (50 mg total) by mouth at bedtime. 30 tablet 1  . zolpidem (AMBIEN) 5 MG tablet Take 5 mg by mouth at bedtime as needed for sleep.     No current facility-administered medications for this visit.    Allergies:   No Known Allergies  Social History:  The patient  reports that she quit smoking about 4 years ago. Her smoking use included Cigarettes. She has a 30 pack-year smoking history. She has never used smokeless tobacco. She reports that she does not drink alcohol or use illicit drugs.   Family history:   Family History  Problem Relation Age of Onset  . Hypertension Mother   . Diabetes Mother   . Kidney disease Mother   . Depression Mother   . Kidney disease Maternal Grandfather   . Diabetes Sister   . Hypertension Sister   . Bipolar disorder Sister     ROS:  Please see the history of present illness.  All other systems reviewed and negative.   PHYSICAL EXAM: VS:  BP 130/104 mmHg  Pulse 57  Ht 5' 5"  (1.651 m)  Wt 242  lb (109.77 kg)  BMI 40.27 kg/m2  SpO2 97% obese well developed, in no acute distress HEENT: Pupils are equal round react to light accommodation extraocular movements are intact.  Neck: no JVDNo cervical lymphadenopathy. Cardiac: Regular rate and rhythm without murmurs rubs or gallops. Lungs:  clear to auscultation bilaterally, no wheezing, rhonchi or rales Abd: soft, tender on the right side, positive bowel sounds all quadrants, no hepatosplenomegaly Musculoskeletal:lower back is nontender. Ext: no lower extremity edema.  2+ radial and dorsalis pedis pulses. Skin: warm  and dry Neuro:  Grossly normal     ASSESSMENT AND PLAN:  Problem List Items Addressed This Visit    Severe obesity ([place])   S/P left heart catheterization by percutaneous approach   Peripheral vascular disease ([place])   Hyperlipidemia   Essential hypertension - Primary      Overall patient appears to be doing well. She's had some right lower back pain and on exam she had some right sided abdominal pain. She reports she's not had a bowel movement probably in a week. We discussed certain foods that would aid in her digestion and bowel movements as well as weight loss. We also discussed exercising on a daily basis. And her need to lose about 50 pounds. She says she has a nutritionist. Blood pressure is mildly elevated. Will continue all current medications.she had ABIs of the lower extremities in January and there are 0.95 on the right 0.93 on the left. We discussed that their biggest improvement she can make in her life as well as weight loss. She'll follow-up with Dr. Gwenlyn Found in 6 months.

## 2015-07-02 ENCOUNTER — Emergency Department (HOSPITAL_BASED_OUTPATIENT_CLINIC_OR_DEPARTMENT_OTHER)
Admission: EM | Admit: 2015-07-02 | Discharge: 2015-07-02 | Disposition: A | Discharge status: Home / Self Care | Payer: BLUE CROSS/BLUE SHIELD | Attending: Emergency Medicine | Admitting: Emergency Medicine

## 2015-07-02 ENCOUNTER — Encounter: Payer: Self-pay | Admitting: Internal Medicine

## 2015-07-02 ENCOUNTER — Encounter (HOSPITAL_BASED_OUTPATIENT_CLINIC_OR_DEPARTMENT_OTHER): Payer: Self-pay | Admitting: *Deleted

## 2015-07-02 DIAGNOSIS — I129 Hypertensive chronic kidney disease with stage 1 through stage 4 chronic kidney disease, or unspecified chronic kidney disease: Secondary | ICD-10-CM | POA: Insufficient documentation

## 2015-07-02 DIAGNOSIS — J4530 Mild persistent asthma, uncomplicated: Secondary | ICD-10-CM | POA: Diagnosis not present

## 2015-07-02 DIAGNOSIS — G4700 Insomnia, unspecified: Secondary | ICD-10-CM | POA: Diagnosis not present

## 2015-07-02 DIAGNOSIS — Z7902 Long term (current) use of antithrombotics/antiplatelets: Secondary | ICD-10-CM | POA: Diagnosis not present

## 2015-07-02 DIAGNOSIS — Z86018 Personal history of other benign neoplasm: Secondary | ICD-10-CM | POA: Insufficient documentation

## 2015-07-02 DIAGNOSIS — R109 Unspecified abdominal pain: Secondary | ICD-10-CM

## 2015-07-02 DIAGNOSIS — Z8673 Personal history of transient ischemic attack (TIA), and cerebral infarction without residual deficits: Secondary | ICD-10-CM | POA: Insufficient documentation

## 2015-07-02 DIAGNOSIS — Z87891 Personal history of nicotine dependence: Secondary | ICD-10-CM | POA: Insufficient documentation

## 2015-07-02 DIAGNOSIS — Z7982 Long term (current) use of aspirin: Secondary | ICD-10-CM | POA: Insufficient documentation

## 2015-07-02 DIAGNOSIS — K219 Gastro-esophageal reflux disease without esophagitis: Secondary | ICD-10-CM | POA: Insufficient documentation

## 2015-07-02 DIAGNOSIS — N183 Chronic kidney disease, stage 3 (moderate): Secondary | ICD-10-CM | POA: Diagnosis not present

## 2015-07-02 DIAGNOSIS — F329 Major depressive disorder, single episode, unspecified: Secondary | ICD-10-CM | POA: Diagnosis not present

## 2015-07-02 DIAGNOSIS — Z7984 Long term (current) use of oral hypoglycemic drugs: Secondary | ICD-10-CM | POA: Diagnosis not present

## 2015-07-02 DIAGNOSIS — E119 Type 2 diabetes mellitus without complications: Secondary | ICD-10-CM | POA: Insufficient documentation

## 2015-07-02 DIAGNOSIS — Z79899 Other long term (current) drug therapy: Secondary | ICD-10-CM | POA: Diagnosis not present

## 2015-07-02 LAB — URINALYSIS, ROUTINE W REFLEX MICROSCOPIC
Bilirubin Urine: NEGATIVE
GLUCOSE, UA: NEGATIVE mg/dL
HGB URINE DIPSTICK: NEGATIVE
Ketones, ur: NEGATIVE mg/dL
LEUKOCYTES UA: NEGATIVE
Nitrite: NEGATIVE
PH: 5.5 (ref 5.0–8.0)
PROTEIN: NEGATIVE mg/dL
SPECIFIC GRAVITY, URINE: 1.009 (ref 1.005–1.030)

## 2015-07-02 LAB — COMPREHENSIVE METABOLIC PANEL
ALK PHOS: 85 U/L (ref 38–126)
ALT: 19 U/L (ref 14–54)
AST: 20 U/L (ref 15–41)
Albumin: 4.2 g/dL (ref 3.5–5.0)
Anion gap: 9 (ref 5–15)
BILIRUBIN TOTAL: 0.5 mg/dL (ref 0.3–1.2)
BUN: 18 mg/dL (ref 6–20)
CALCIUM: 9.2 mg/dL (ref 8.9–10.3)
CO2: 24 mmol/L (ref 22–32)
CREATININE: 1.37 mg/dL — AB (ref 0.44–1.00)
Chloride: 103 mmol/L (ref 101–111)
GFR, EST AFRICAN AMERICAN: 50 mL/min — AB (ref >60)
GFR, EST NON AFRICAN AMERICAN: 43 mL/min — AB (ref >60)
Glucose, Bld: 97 mg/dL (ref 65–99)
Potassium: 4.1 mmol/L (ref 3.5–5.1)
Sodium: 136 mmol/L (ref 135–145)
Total Protein: 7.4 g/dL (ref 6.5–8.1)

## 2015-07-02 LAB — CBC WITH DIFFERENTIAL/PLATELET
BASOS PCT: 0 %
Basophils Absolute: 0 10*3/uL (ref 0.0–0.1)
EOS ABS: 0.1 10*3/uL (ref 0.0–0.7)
EOS PCT: 1 %
HCT: 38.3 % (ref 36.0–46.0)
HEMOGLOBIN: 13.2 g/dL (ref 12.0–15.0)
Lymphocytes Relative: 41 %
Lymphs Abs: 2.5 10*3/uL (ref 0.7–4.0)
MCH: 29.4 pg (ref 26.0–34.0)
MCHC: 34.5 g/dL (ref 30.0–36.0)
MCV: 85.3 fL (ref 78.0–100.0)
Monocytes Absolute: 0.5 10*3/uL (ref 0.1–1.0)
Monocytes Relative: 8 %
NEUTROS PCT: 50 %
Neutro Abs: 3 10*3/uL (ref 1.7–7.7)
Platelets: 305 10*3/uL (ref 150–400)
RBC: 4.49 MIL/uL (ref 3.87–5.11)
RDW: 14.4 % (ref 11.5–15.5)
WBC: 6.1 10*3/uL (ref 4.0–10.5)

## 2015-07-02 MED ORDER — TRAMADOL-ACETAMINOPHEN 37.5-325 MG PO TABS: 1.0000 | ORAL_TABLET | Freq: Four times a day (QID) | ORAL | Status: AC | PRN

## 2015-07-02 MED ORDER — HYDROCODONE-ACETAMINOPHEN 5-325 MG PO TABS
1.0000 | ORAL_TABLET | Freq: Once | ORAL | Status: AC
  Administered 2015-07-02: 1 via ORAL
  Filled 2015-07-02: qty 1

## 2015-07-02 NOTE — ED Provider Notes (Signed)
CSN: [id number]     Arrival date & time 07/02/15  1651 History   First MD Initiated Contact with Patient 07/02/15 1747     Chief Complaint  Patient presents with  . Flank Pain     (Consider location/radiation/quality/duration/timing/severity/associated sxs/prior Treatment) HPI Michelle Nichols is a 55 y.o. female history of chronic kidney disease, asthma, diabetes, comes in for evaluation of right-sided flank pain. Patient reports symptoms have been ongoing for the past one week. Symptoms are gradually worsening. Initially, symptoms were intermittent, but have been persistent over the past one day. She cannot characterize her discomfort. Tylenol was originally helping but now is ineffective. No change with eating. She denies fevers, chills, nausea or vomiting, abdominal pain, diarrhea or constipation, urinary symptoms. No other alleviating or modifying factors.   Past Medical History  Diagnosis Date  . Uterine fibroid     s/p TAH  . Gastroesophageal reflux disease without esophagitis   . Chronic kidney disease, stage III (moderate)     a. Creatinine 10/16: 1.10  . Essential (primary) hypertension   . Insomnia   . Depression   . Mild persistent asthma in adult without complication   . Anxiety states   . Intractable migraine with status migrainosus   . Glucose intolerance (impaired glucose tolerance)     a. A1c 10/16: 6.4  . 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 Seizure ([place])   . Vertigo   . PAD (peripheral artery disease) ([place])     a. Bilat vascular disease dx in [place] in 2014 with stable bilat LE claudication  . Vertebral artery stenosis   . History of CVA (cerebrovascular accident)   . History of nuclear stress test     Myoview 10/16: EF 56%, low risk, normal perfusion  . Chest pain     clean coronaries on cath 05/31/2015  . Diabetes mellitus without complication ([place])     type 2   Past Surgical History  Procedure Laterality Date  . Partial hysterectomy  1995    for fibroids  .  Colonoscopy  2014  . Cardiac catheterization N/A 05/31/2015    Procedure: Left Heart Cath and Coronary Angiography;  Surgeon: Lorretta Harp, MD;  Location: [place] [facility];  Service: Cardiovascular;  Laterality: N/A;   Family History  Problem Relation Age of Onset  . Hypertension Mother   . Diabetes Mother   . Kidney disease Mother   . Depression Mother   . Kidney disease Maternal Grandfather   . Diabetes Sister   . Hypertension Sister   . Bipolar disorder Sister    Social History  Substance Use Topics  . Smoking status: Former Smoker -- 1.50 packs/day for 20 years    Types: Cigarettes    Quit date: 08/23/2010  . Smokeless tobacco: Never Used  . Alcohol Use: No   OB History    No data available     Review of Systems A 10 point review of systems was completed and was negative except for pertinent positives and negatives as mentioned in the history of present illness     Allergies  Review of patient's allergies indicates no known allergies.  Home Medications   Prior to Admission medications   Medication Sig Start Date End Date Taking? Authorizing Provider  albuterol (PROVENTIL HFA;VENTOLIN HFA) 108 (90 BASE) MCG/ACT inhaler Inhale into the lungs every 6 (six) hours as needed for wheezing or shortness of breath. Reported on 06/25/2015   Yes Historical Provider, MD  aspirin EC 81 MG tablet  Take 1 tablet (81 mg total) by mouth daily. 02/16/15  Yes Scott Joylene Draft, PA-C  atorvastatin (LIPITOR) 40 MG tablet Take 40 mg by mouth daily.   Yes Historical Provider, MD  blood glucose meter kit and supplies KIT Use meter to check blood sugar once per day or as directed by physician. 02/15/15  Yes Binnie Rail, MD  buPROPion (WELLBUTRIN XL) 300 MG 24 hr tablet Take 1 tablet (300 mg total) by mouth daily. 05/26/15  Yes Kathlee Nations, MD  Cholecalciferol (VITAMIN D3) 5000 UNITS CAPS Take 1 capsule by mouth daily.    Yes Historical Provider, MD  clopidogrel (PLAVIX) 75 MG tablet Take 1  tablet (75 mg total) by mouth daily. 02/18/15  Yes Hendricks Limes, MD  diltiazem (DILACOR XR) 120 MG 24 hr capsule Take 1 capsule (120 mg total) by mouth daily. 02/18/15  Yes Binnie Rail, MD  famotidine (PEPCID) 20 MG tablet One at bedtime 06/25/15  Yes Tanda Rockers, MD  glucose blood test strip Use as directed to check sugars up to 4 times a day. DX E11.09 02/24/15  Yes Binnie Rail, MD  isosorbide mononitrate (IMDUR) 30 MG 24 hr tablet Take 30 mg by mouth daily.   Yes Historical Provider, MD  lamoTRIgine (LAMICTAL) 25 MG tablet Take 2 tablets (50 mg total) by mouth daily. 05/26/15  Yes Kathlee Nations, MD  Lancets [facility] [place] ULTRASOFT) lancets Use as directed to check sugars once daily and prn 02/15/15  Yes Binnie Rail, MD  metFORMIN (GLUCOPHAGE) 500 MG tablet Take 1 tablet (500 mg total) by mouth daily with supper. 03/18/15  Yes Binnie Rail, MD  mometasone-formoterol (DULERA) 100-5 MCG/ACT AERO Inhale 2 puffs into the lungs 2 (two) times daily. 06/03/15  Yes Binnie Rail, MD  montelukast (SINGULAIR) 10 MG tablet Take 1 tablet (10 mg total) by mouth at bedtime. 02/10/15  Yes Binnie Rail, MD  Multiple Vitamin (MULTIVITAMIN) tablet Take 1 tablet by mouth daily.   Yes Historical Provider, MD  pantoprazole (PROTONIX) 40 MG tablet Take 1 tablet (40 mg total) by mouth daily. Take 30-60 min before first meal of the day 06/25/15  Yes Tanda Rockers, MD  traZODone (DESYREL) 50 MG tablet Take 1 tablet (50 mg total) by mouth at bedtime. 05/26/15  Yes Kathlee Nations, MD  zolpidem (AMBIEN) 5 MG tablet Take 5 mg by mouth at bedtime as needed for sleep.   Yes Historical Provider, MD  traMADol-acetaminophen (ULTRACET) 37.5-325 MG tablet Take 1 tablet by mouth every 6 (six) hours as needed. 07/02/15   Comer Locket, PA-C   BP 119/91 mmHg  Pulse 64  Temp(Src) 98.5 F (36.9 C) (Oral)  Resp 18  Ht _0  (1.651 m)  Wt 108.863 kg  BMI 39.94 kg/m2  SpO2 100% Physical Exam  Constitutional: She is oriented  to person, place, and time. She appears well-developed and well-nourished.  Obese African-American female  HENT:  Head: Normocephalic and atraumatic.  Mouth/Throat: Oropharynx is clear and moist.  Eyes: Conjunctivae are normal. Pupils are equal, round, and reactive to light. Right eye exhibits no discharge. Left eye exhibits no discharge. No scleral icterus.  Neck: Neck supple.  Cardiovascular: Normal rate, regular rhythm and normal heart sounds.   Pulmonary/Chest: Effort normal and breath sounds normal. No respiratory distress. She has no wheezes. She has no rales.  Abdominal: Soft. There is no tenderness.  Musculoskeletal: She exhibits tenderness.  Tenderness diffusely throughout mid right flank  with no crepitus, lesions or other abnormalities.  Neurological: She is alert and oriented to person, place, and time.  Cranial Nerves II-XII grossly intact  Skin: Skin is warm and dry. No rash noted.  Psychiatric: She has a normal mood and affect.  Nursing note and vitals reviewed.   ED Course  Procedures (including critical care time) Labs Review Labs Reviewed  COMPREHENSIVE METABOLIC PANEL - Abnormal; Notable for the following:    Creatinine, Ser 1.37 (*)    GFR calc non Af Amer 43 (*)    GFR calc Af Amer 50 (*)    All other components within normal limits  URINALYSIS, ROUTINE W REFLEX MICROSCOPIC (NOT AT [facility])  CBC WITH DIFFERENTIAL/PLATELET    Imaging Review No results found. I have personally reviewed and evaluated these images and lab results as part of my medical decision-making.   EKG Interpretation None     Meds given in ED:  Medications  HYDROcodone-acetaminophen (NORCO/VICODIN) 5-325 MG per tablet 1 tablet (1 tablet Oral Given 07/02/15 1836)    New Prescriptions   TRAMADOL-ACETAMINOPHEN (ULTRACET) 37.5-325 MG TABLET    Take 1 tablet by mouth every 6 (six) hours as needed.   Filed Vitals:   07/02/15 1659  BP: 119/91  Pulse: 64  Temp: 98.5 F (36.9 C)   TempSrc: Oral  Resp: 18  Height: _0  (1.651 m)  Weight: 108.863 kg  SpO2: 100%    MDM  Karsynn Deweese is a 55 y.o. female presents for ongoing right flank pain over the past one week. Patient has history of CKD, hypertension, diabetes, asthma. Of note, she does follow with cardiology and had a left heart cath on 05/31/15 that showed normal coronaries and EF. On Arrival, she is hemodynamically stable, afebrile. On exam, she is tender in her right flank, but also laughs and does not appear overly distressed. Benign abdominal exam. Physical exam is otherwise unremarkable. Urine without evidence of infection or stone. Plan to obtain screening labs. Symptoms most consistent with musculoskeletal pain. Labs are unremarkable. Patient feels much better after oral analgesia. Plan to discharge home and follow-up with PCP in the next 2-3 days. Patient verbalized understanding and agrees with this plan. The patient appears reasonably screened and/or stabilized for discharge and I doubt any other medical condition or other [facility] requiring further screening, evaluation, or treatment in the ED at this time prior to discharge.   Final diagnoses:  Right flank pain       Comer Locket, PA-C 07/02/15 1950  Merrily Pew, MD 07/03/15 1253

## 2015-07-02 NOTE — Discharge Instructions (Signed)
There does not appear to be an emergent cause for your symptoms at this time. Your blood work and urine as well as your exam were all reassuring. Please take your medication as prescribed, do not take before driving as it can make you very drowsy. Follow-up with your doctor in the next 3 days for reevaluation. Return to ED for any new or worsening symptoms as we discussed.  Flank Pain Flank pain refers to pain that is located on the side of the body between the upper abdomen and the back. The pain may occur over a short period of time (acute) or may be long-term or reoccurring (chronic). It may be mild or severe. Flank pain can be caused by many things. CAUSES  Some of the more common causes of flank pain include:  Muscle strains.   Muscle spasms.   A disease of your spine (vertebral disk disease).   A lung infection (pneumonia).   Fluid around your lungs (pulmonary edema).   A kidney infection.   Kidney stones.   A very painful skin rash caused by the chickenpox virus (shingles).   Gallbladder disease.  [place] care will depend on the cause of your pain. In general,  Rest as directed by your caregiver.  Drink enough fluids to keep your urine clear or pale yellow.  Only take over-the-counter or prescription medicines as directed by your caregiver. Some medicines may help relieve the pain.  Tell your caregiver about any changes in your pain.  Follow up with your caregiver as directed. SEEK IMMEDIATE MEDICAL CARE IF:   Your pain is not controlled with medicine.   You have new or worsening symptoms.  Your pain increases.   You have abdominal pain.   You have shortness of breath.   You have persistent nausea or vomiting.   You have swelling in your abdomen.   You feel faint or pass out.   You have blood in your urine.  You have a fever or persistent symptoms for more than 2-3 days.  You have a fever and your symptoms suddenly  get worse. MAKE SURE YOU:   Understand these instructions.  Will watch your condition.  Will get help right away if you are not doing well or get worse.   This information is not intended to replace advice given to you by your health care provider. Make sure you discuss any questions you have with your health care provider.   Document Released: 06/15/2005 Document Revised: 01/17/2012 Document Reviewed: 12/07/2011 Elsevier Interactive Patient Education [employer].

## 2015-07-02 NOTE — ED Notes (Signed)
Pt c/o right side flank pain x 1 week- last BM today after taking laxative yesterday. Denies fever, denies n/v. States she feels light headed

## 2015-07-05 ENCOUNTER — Encounter: Payer: Self-pay | Admitting: Allergy and Immunology

## 2015-07-05 ENCOUNTER — Ambulatory Visit (INDEPENDENT_AMBULATORY_CARE_PROVIDER_SITE_OTHER): Payer: BLUE CROSS/BLUE SHIELD | Admitting: Allergy and Immunology

## 2015-07-05 VITALS — BP 146/88 | HR 60 | Temp 98.3°F | Resp 18 | Ht 65.35 in | Wt 243.2 lb

## 2015-07-05 DIAGNOSIS — J3089 Other allergic rhinitis: Secondary | ICD-10-CM | POA: Diagnosis not present

## 2015-07-05 DIAGNOSIS — E0800 Diabetes mellitus due to underlying condition with hyperosmolarity without nonketotic hyperglycemic-hyperosmolar coma (NKHHC): Secondary | ICD-10-CM

## 2015-07-05 DIAGNOSIS — J45901 Unspecified asthma with (acute) exacerbation: Secondary | ICD-10-CM

## 2015-07-05 DIAGNOSIS — I10 Essential (primary) hypertension: Secondary | ICD-10-CM

## 2015-07-05 MED ORDER — MOMETASONE FURO-FORMOTEROL FUM 200-5 MCG/ACT IN AERO: 2.0000 | INHALATION_SPRAY | Freq: Two times a day (BID) | RESPIRATORY_TRACT | Status: AC

## 2015-07-05 MED ORDER — FLUTICASONE PROPIONATE 50 MCG/ACT NA SUSP: 2.0000 | Freq: Every day | NASAL | Status: AC

## 2015-07-05 MED ORDER — LEVOCETIRIZINE DIHYDROCHLORIDE 5 MG PO TABS: 5.0000 mg | ORAL_TABLET | Freq: Every evening | ORAL | Status: DC

## 2015-07-05 MED ORDER — PREDNISONE 1 MG PO TABS: 10.0000 mg | ORAL_TABLET | ORAL | Status: AC

## 2015-07-05 NOTE — Assessment & Plan Note (Signed)
   Michelle Nichols has been asked to carefully monitor blood glucose levels while on prednisone.  She has verbalized understanding and has agreed to do so.

## 2015-07-05 NOTE — Assessment & Plan Note (Addendum)
Tyyne is sensitized to cat hair and dog epithelia.  Aeroallergen avoidance measures have been discussed and provided in written form.  A prescription has been provided for fluticasone nasal spray, 2 sprays per nostril daily as needed. Proper nasal spray technique has been discussed and demonstrated.  I have also recommended nasal saline spray (i.e. Simply Saline) as needed prior to medicated nasal sprays.  A prescription has been provided for levocetirizine, 5 mg daily as needed.  If allergen avoidance measures and medications fail to adequately relieve symptoms, aeroallergen immunotherapy will be considered.

## 2015-07-05 NOTE — Progress Notes (Signed)
New Patient Note  RE: Michelle Nichols MRN: [id number] DOB: 24-Dec-1960 Date of Office Visit: 07/05/2015  Referring provider: Golden Circle, FNP Primary care provider: Binnie Rail, MD  Chief Complaint: Allergic Rhinitis  and Asthma   History of present illness: HPI Comments: Michelle Nichols is a 55 y.o. female who presents today for consultation of asthma and rhinitis.  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  She believes that her asthma and allergy symptoms are exacerbated with exposure to the cat because she requires albuterol rescue multiple times per day while in that particular house. In early February, she started taking Dulera100/5 g, 2 inhalations twice a day, and montelukast 10 mg daily  These medications have helped to reduce her asthma symptoms, however she still requires albuterol multiple times per day while in the house with a cat.  She does not use a spacer device with HFA inhalers. Her allergy symptoms consist of nasal congestion, rhinorrhea, sneezing, and postnasal drainage.  The symptoms occur year round but her more severe when exposed to cats and dogs.    Assessment and plan: Perennial allergic rhinitis [name] is sensitized to cat hair and dog epithelia.  Aeroallergen avoidance measures have been discussed and provided in written form.  A prescription has been provided for fluticasone nasal spray, 2 sprays per nostril daily as needed. Proper nasal spray technique has been discussed and demonstrated.  I have also recommended nasal saline spray (i.e. Simply Saline) as needed prior to medicated nasal sprays.  A prescription has been provided for levocetirizine, 5 mg daily as needed.  If allergen avoidance measures and medications fail to adequately relieve symptoms, aeroallergen immunotherapy will be considered.  Asthma with acute  exacerbation  Prednisone has been provided, 20 mg x 4 days, 10 mg x1 day, then stop.  A prescription has been provided for [facility] (mometasone/formoterol) 200/5 g, 2 inhalations twice a day.  To maximize pulmonary deposition, a spacer has been provided along with instructions for its proper administration with an HFA inhaler.  Continue montelukast 10 mg daily at bedtime and albuterol every 4-6 hours as needed.  The patient has been asked to contact me if her symptoms persist or progress. Otherwise, she may return for follow up in 2 months.  Diabetes ([place])  [name] has been asked to carefully monitor blood glucose levels while on prednisone.  She has verbalized understanding and has agreed to do so.  Essential hypertension Blood pressure was elevated today on 2 separate readings today despite reported compliance with diltiazem.  [name] has been asked to follow up with her primary care physician regarding this issue.  Patient has verbalized understanding and has agreed to do so.    Meds ordered this encounter  Medications  . mometasone-formoterol (DULERA) 200-5 MCG/ACT AERO    Sig: Inhale 2 puffs into the lungs 2 (two) times daily.    Dispense:  1 Inhaler    Refill:  3  . levocetirizine (XYZAL) 5 MG tablet    Sig: Take 1 tablet (5 mg total) by mouth every evening.    Dispense:  30 tablet    Refill:  5  . fluticasone (FLONASE) 50 MCG/ACT nasal spray    Sig: Place 2 sprays into both nostrils daily.    Dispense:  16 g    Refill:  3  . predniSONE (DELTASONE) tablet 10  mg    Sig:     Diagnositics: Spirometry:  Spirometry reveals an FVC of 1.88 L and an FEV1 of 1.70 L (76% predicted) with significant (360 mL, 21%) post bronchodilator improvement. Allergy skin testing: Positive to cat hair and dog epithelia.    Physical examination: Blood pressure 146/88, pulse 60, temperature 98.3 F (36.8 C), resp. rate 18, height 5' 5.35" (1.66 m), weight 243 lb 2.7 oz (110.3  kg).  General: Alert, interactive, in no acute distress. HEENT: TMs pearly gray, turbinates edematous with crusty discharge, post-pharynx erythematous. Neck: Supple without lymphadenopathy. Lungs: Mildly decreased breath sounds bilaterally without wheezing, rhonchi or rales. CV: Normal S1, S2 without murmurs. Abdomen: Nondistended, nontender. Skin: Warm and dry, without lesions or rashes. Extremities:  No clubbing, cyanosis or edema. Neuro:   Grossly intact.  Review of systems:  Review of Systems  Constitutional: Negative for fever, chills and weight loss.  HENT: Positive for congestion. Negative for nosebleeds.   Eyes: Negative for blurred vision.  Respiratory: Positive for cough, sputum production, shortness of breath and wheezing. Negative for hemoptysis.   Cardiovascular: Negative for chest pain.  Gastrointestinal: Negative for diarrhea and constipation.  Genitourinary: Negative for dysuria.  Musculoskeletal: Negative for myalgias and joint pain.  Skin: Negative for itching and rash.  Neurological: Negative for dizziness.  Endo/Heme/Allergies: Positive for environmental allergies. Does not bruise/bleed easily.    Past medical history:  Past Medical History  Diagnosis Date  . Uterine fibroid     s/p TAH  . Gastroesophageal reflux disease without esophagitis   . Chronic kidney disease, stage III (moderate)     a. Creatinine 10/16: 1.10  . Essential (primary) hypertension   . Insomnia   . Depression   . Mild persistent asthma in adult without complication   . Anxiety states   . Intractable migraine with status migrainosus   . Glucose intolerance (impaired glucose tolerance)     a. A1c 10/16: 6.4  . 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 Seizure ([place])   . Vertigo   . PAD (peripheral artery disease) ([place])     a. Bilat vascular disease dx in [place] in 2014 with stable bilat LE claudication  . Vertebral artery stenosis   . History of CVA (cerebrovascular accident)   . History of  nuclear stress test     Myoview 10/16: EF 56%, low risk, normal perfusion  . Chest pain     clean coronaries on cath 05/31/2015  . Diabetes mellitus without complication ([place])     type 2    Past surgical history:  Past Surgical History  Procedure Laterality Date  . Partial hysterectomy  1995    for fibroids  . Colonoscopy  2014  . Cardiac catheterization N/A 05/31/2015    Procedure: Left Heart Cath and Coronary Angiography;  Surgeon: Lorretta Harp, MD;  Location: [place] [facility];  Service: Cardiovascular;  Laterality: N/A;    Family history: Family History  Problem Relation Age of Onset  . Hypertension Mother   . Diabetes Mother   . Kidney disease Mother   . Depression Mother   . Kidney disease Maternal Grandfather   . Diabetes Sister   . Hypertension Sister   . Bipolar disorder Sister   . Asthma Sister   . Eczema Father   . Allergic rhinitis Neg Hx   . Urticaria Neg Hx     Social history: Social History   Social History  . Marital Status: Divorced    Spouse Name: N/A  .  Number of Children: 4  . 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 Smoking status: Former Smoker -- 1.50 packs/day for 20 years    Types: Cigarettes    Quit date: 08/23/2010  . Smokeless tobacco: Never Used  . Alcohol Use: No  . Drug Use: No  . Sexual Activity: Not on file   Other Topics Concern  . Not on file   Social History Narrative   Live in companion      No regular exercise   Environmental History: the patient lives in an apartment with carpeting throughout and central air/heat.  There are no pets in the apartment.  She is a former cigarette smoker.  She is a home health aide.    Medication List       This list is accurate as of: 07/05/15  6:09 PM.  Always use your most recent med list.               albuterol 108 (90 Base) MCG/ACT inhaler  Commonly known as:  PROVENTIL HFA;VENTOLIN HFA  Inhale into the  lungs every 6 (six) hours as needed for wheezing or shortness of breath. Reported on 06/25/2015     aspirin EC 81 MG tablet  Take 1 tablet (81 mg total) by mouth daily.     atorvastatin 40 MG tablet  Commonly known as:  LIPITOR  Take 40 mg by mouth daily.     blood glucose meter kit and supplies Kit  Use meter to check blood sugar once per day or as directed by physician.     buPROPion 300 MG 24 hr tablet  Commonly known as:  WELLBUTRIN XL  Take 1 tablet (300 mg total) by mouth daily.     clopidogrel 75 MG tablet  Commonly known as:  PLAVIX  Take 1 tablet (75 mg total) by mouth daily.     diltiazem 120 MG 24 hr capsule  Commonly known as:  DILACOR XR  Take 1 capsule (120 mg total) by mouth daily.     famotidine 20 MG tablet  Commonly known as:  PEPCID  One at bedtime     fluticasone 50 MCG/ACT nasal spray  Commonly known as:  FLONASE  Place 2 sprays into both nostrils daily.     glucose blood test strip  Use as directed to check sugars up to 4 times a day. DX E11.09     isosorbide mononitrate 30 MG 24 hr tablet  Commonly known as:  IMDUR  Take 30 mg by mouth daily.     lamoTRIgine 25 MG tablet  Commonly known as:  LAMICTAL  Take 2 tablets (50 mg total) by mouth daily.     levocetirizine 5 MG tablet  Commonly known as:  XYZAL  Take 1 tablet (5 mg total) by mouth every evening.     metFORMIN 500 MG tablet  Commonly known as:  GLUCOPHAGE  Take 1 tablet (500 mg total) by mouth daily with supper.     mometasone-formoterol 200-5 MCG/ACT Aero  Commonly known as:  DULERA  Inhale 2 puffs into the lungs 2 (two) times daily.     montelukast 10 MG tablet  Commonly known as:  SINGULAIR  Take 1 tablet (10 mg total) by mouth at bedtime.     multivitamin tablet  Take 1 tablet by mouth daily.     onetouch ultrasoft lancets  Use as directed to check sugars  once daily and prn     pantoprazole 40 MG tablet  Commonly known as:  PROTONIX  Take 1 tablet (40 mg total) by  mouth daily. Take 30-60 min before first meal of the day     traMADol-acetaminophen 37.5-325 MG tablet  Commonly known as:  ULTRACET  Take 1 tablet by mouth every 6 (six) hours as needed.     traZODone 50 MG tablet  Commonly known as:  DESYREL  Take 1 tablet (50 mg total) by mouth at bedtime.     Vitamin D3 5000 units Caps  Take 1 capsule by mouth daily.     zolpidem 5 MG tablet  Commonly known as:  AMBIEN  Take 5 mg by mouth at bedtime as needed for sleep.        Known medication allergies: No Known Allergies  I appreciate the opportunity to take part in this Miracle's care. Please do not hesitate to contact me with questions.  Sincerely,   R. Edgar Frisk, MD

## 2015-07-05 NOTE — Assessment & Plan Note (Addendum)
Blood pressure was elevated today on 2 separate readings today despite reported compliance with diltiazem.  Sukhman has been asked to follow up with her primary care physician regarding this issue.  Patient has verbalized understanding and has agreed to do so.

## 2015-07-05 NOTE — Patient Instructions (Addendum)
Perennial allergic rhinitis Michelle Nichols is sensitized to cat hair and dog epithelia.  Aeroallergen avoidance measures have been discussed and provided in written form.  A prescription has been provided for fluticasone nasal spray, 2 sprays per nostril daily as needed. Proper nasal spray technique has been discussed and demonstrated.  I have also recommended nasal saline spray (i.e. Simply Saline) as needed prior to medicated nasal sprays.  A prescription has been provided for levocetirizine, 5 mg daily as needed.  If allergen avoidance measures and medications fail to adequately relieve symptoms, aeroallergen immunotherapy will be considered.  Asthma with acute exacerbation  Prednisone has been provided, 20 mg x 4 days, 10 mg x1 day, then stop.  A prescription has been provided for [facility] (mometasone/formoterol) 200/5 g, 2 inhalations twice a day.  To maximize pulmonary deposition, a spacer has been provided along with instructions for its proper administration with an HFA inhaler.  Continue montelukast 10 mg daily at bedtime and albuterol every 4-6 hours as needed.  The patient has been asked to contact me if her symptoms persist or progress. Otherwise, she may return for follow up in 2 months.  Diabetes ([place])  Michelle Nichols has been asked to carefully monitor blood glucose levels while on prednisone.  She has verbalized understanding and has agreed to do so.  Essential hypertension Blood pressure was elevated today on 2 separate readings today despite reported compliance with diltiazem.  Michelle Nichols has been asked to follow up with her primary care physician regarding this issue.  Patient has verbalized understanding and has agreed to do so.    Return in about 2 months (around 09/02/2015), or if symptoms worsen or fail to improve.  Control of Dog or Cat Allergen  Avoidance is the best way to manage a dog or cat allergy. If you have a dog or cat and are allergic to dog or cats, consider  removing the dog or cat from the home. If you have a dog or cat but don't want to find it a new home, or if your family wants a pet even though someone in the household is allergic, here are some strategies that may help keep symptoms at bay:  1. Keep the pet out of your bedroom and restrict it to only a few rooms. Be advised that keeping the dog or cat in only one room will not limit the allergens to that room. 2. Don't pet, hug or kiss the dog or cat; if you do, wash your hands with soap and water. 3. High-efficiency particulate air (HEPA) cleaners run continuously in a bedroom or living room can reduce allergen levels over time. 4. Regular use of a high-efficiency vacuum cleaner or a central vacuum can reduce allergen levels. 5. Giving your dog or cat a bath at least once a week can reduce airborne allergen.

## 2015-07-05 NOTE — Assessment & Plan Note (Addendum)
   Prednisone has been provided, 20 mg x 4 days, 10 mg x1 day, then stop.  A prescription has been provided for [facility] (mometasone/formoterol) 200/5 g, 2 inhalations twice a day.  To maximize pulmonary deposition, a spacer has been provided along with instructions for its proper administration with an HFA inhaler.  Continue montelukast 10 mg daily at bedtime and albuterol every 4-6 hours as needed.  The patient has been asked to contact me if her symptoms persist or progress. Otherwise, she may return for follow up in 2 months.

## 2015-07-06 ENCOUNTER — Encounter: Payer: Self-pay | Admitting: Internal Medicine

## 2015-07-06 ENCOUNTER — Telehealth: Payer: Self-pay

## 2015-07-06 NOTE — Telephone Encounter (Signed)
Spoke with patient about elevated blood pressure issue at office visit on 07/05/15, per Dr. Verlin Fester.  Patient is informed to contact primary care provider and discuss this issue.  Michelle Nichols states she will and voiced understood.

## 2015-07-07 MED ORDER — METAXALONE 800 MG PO TABS: 800.0000 mg | ORAL_TABLET | Freq: Three times a day (TID) | ORAL | Status: AC | PRN

## 2015-07-08 ENCOUNTER — Encounter: Payer: Self-pay | Admitting: *Deleted

## 2015-07-13 ENCOUNTER — Telehealth: Payer: Self-pay | Admitting: Allergy and Immunology

## 2015-07-13 NOTE — Telephone Encounter (Signed)
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 They say outside 90% of the time they are outside, but she is concerned about the 10% that they were inside. Does she need to use something.  [phone].

## 2015-07-13 NOTE — Telephone Encounter (Signed)
She should certainly take her allergy medications prior to and after to cat exposure and in addition may consider an inexpensive filtration mask to wear during exposure.

## 2015-07-13 NOTE — Telephone Encounter (Signed)
SPOKE WITH PT SHE IS GOING TO PASS THE INFORMATION ALONG TO HER BOSS

## 2015-07-20 ENCOUNTER — Telehealth: Payer: Self-pay | Admitting: Internal Medicine

## 2015-07-20 NOTE — Telephone Encounter (Signed)
Pt called said that she needs to mover her hep injections.  She wasn't sure if she could move it up?  Also her aid had shingles last week and wanted to know if she would be exposed to it and needs a shot ?  Can you call her when you get a chance

## 2015-07-20 NOTE — Telephone Encounter (Signed)
LVM for pt to call back.

## 2015-07-20 NOTE — Telephone Encounter (Signed)
If she has not had chicken pox she can get chicken pox from shingles. She can not get shingles from shingles.   She can move up her hep vaccine if she is not moving it too much.Marland KitchenMarland KitchenMarland Kitchen

## 2015-07-20 NOTE — Telephone Encounter (Signed)
Please advise 

## 2015-07-21 NOTE — Telephone Encounter (Signed)
Spoke with pt, she has verified with her insurance that they will cover the Hep A/B vac early. Pt has rescheduled appt for this Friday 17th before leaving to work out of [place] for 2[place]3 months.

## 2015-07-21 NOTE — Telephone Encounter (Signed)
LVM for pt to call back.

## 2015-07-21 NOTE — Telephone Encounter (Signed)
Pt is leaving Sunday for several months and she is requesting to have her Hep A/B vaccines done this Friday. She is also wanting to get her shingle shot on Friday.

## 2015-07-21 NOTE — Telephone Encounter (Signed)
Please advise, This would be 3 weeks early from the original scheduled appt.

## 2015-07-23 ENCOUNTER — Ambulatory Visit (INDEPENDENT_AMBULATORY_CARE_PROVIDER_SITE_OTHER): Payer: BLUE CROSS/BLUE SHIELD

## 2015-07-23 DIAGNOSIS — Z23 Encounter for immunization: Secondary | ICD-10-CM

## 2015-07-26 ENCOUNTER — Ambulatory Visit (HOSPITAL_COMMUNITY): Payer: Self-pay | Admitting: Psychiatry

## 2015-07-27 ENCOUNTER — Ambulatory Visit: Payer: Self-pay | Admitting: Cardiovascular Disease

## 2015-08-02 ENCOUNTER — Ambulatory Visit (HOSPITAL_COMMUNITY): Payer: Self-pay | Admitting: Psychiatry

## 2015-08-09 ENCOUNTER — Telehealth (HOSPITAL_COMMUNITY): Payer: Self-pay

## 2015-08-09 ENCOUNTER — Other Ambulatory Visit (HOSPITAL_COMMUNITY): Payer: Self-pay | Admitting: Psychiatry

## 2015-08-09 NOTE — Telephone Encounter (Signed)
Patient calling, she is currently working in [place] until July. She wants to know if you can continue to refill her medications - Wellbutrin, Lamictil and Trazodone, until she comes back. She does not want to have to find a psychiatrist there for just a couple of months. Please advise, thank you

## 2015-08-09 NOTE — Telephone Encounter (Signed)
I have seen only once and cannot refill the prescription until she had appointment.  She need to be seen.

## 2015-08-10 ENCOUNTER — Ambulatory Visit: Payer: 59

## 2015-08-10 NOTE — Telephone Encounter (Signed)
I called patient to let her know that Dr. Adele Schilder can not at this time refill her medications without an office visit. This information was left on her voicemail with our number to call if she has any questions

## 2015-08-13 ENCOUNTER — Ambulatory Visit: Payer: Self-pay | Admitting: Cardiovascular Disease

## 2015-08-17 ENCOUNTER — Other Ambulatory Visit: Payer: Self-pay | Admitting: Nephrology

## 2015-08-17 DIAGNOSIS — N189 Chronic kidney disease, unspecified: Secondary | ICD-10-CM

## 2015-08-31 ENCOUNTER — Encounter: Payer: Self-pay | Admitting: Internal Medicine

## 2015-08-31 ENCOUNTER — Telehealth: Payer: Self-pay

## 2015-08-31 ENCOUNTER — Ambulatory Visit: Payer: Self-pay | Admitting: *Deleted

## 2015-08-31 NOTE — Telephone Encounter (Signed)
Per [facility] results on 06/09/2015  Notes Recorded by Newt Minion, RN on 08/16/2015 at 2:20 PM Pt cancelled 4/7 appt stated [place] is [place] until July 2017. Notes Recorded by Newt Minion, RN on 07/26/2015 at 9:20 AM Pt has appt on 4/7 to discuss Notes Recorded by Lorretta Harp, MD on 06/09/2015 at 9:37 PM Pt has an appointment 3/17 to discuss

## 2015-09-02 ENCOUNTER — Ambulatory Visit: Payer: BLUE CROSS/BLUE SHIELD | Admitting: Allergy and Immunology

## 2015-09-20 ENCOUNTER — Other Ambulatory Visit: Payer: Self-pay | Admitting: Internal Medicine

## 2015-10-05 ENCOUNTER — Ambulatory Visit: Payer: Self-pay | Admitting: Allergy and Immunology

## 2015-10-05 ENCOUNTER — Ambulatory Visit: Payer: Self-pay | Admitting: *Deleted

## 2015-10-06 ENCOUNTER — Other Ambulatory Visit: Payer: Self-pay

## 2015-10-08 ENCOUNTER — Ambulatory Visit (HOSPITAL_COMMUNITY): Payer: Self-pay | Admitting: Psychiatry

## 2015-11-23 ENCOUNTER — Ambulatory Visit: Payer: Self-pay | Admitting: Internal Medicine

## 2015-11-27 ENCOUNTER — Other Ambulatory Visit: Payer: Self-pay | Admitting: Internal Medicine

## 2015-12-07 ENCOUNTER — Other Ambulatory Visit: Payer: Self-pay | Admitting: Nephrology

## 2015-12-07 DIAGNOSIS — N183 Chronic kidney disease, stage 3 unspecified: Secondary | ICD-10-CM

## 2015-12-14 ENCOUNTER — Encounter: Payer: Self-pay | Admitting: Allergy and Immunology

## 2015-12-14 ENCOUNTER — Encounter: Payer: Self-pay | Admitting: Internal Medicine

## 2015-12-17 ENCOUNTER — Other Ambulatory Visit: Payer: Self-pay

## 2016-01-05 ENCOUNTER — Other Ambulatory Visit: Payer: Self-pay | Admitting: Internal Medicine

## 2016-02-11 ENCOUNTER — Other Ambulatory Visit: Payer: Self-pay | Admitting: Internal Medicine

## 2016-02-19 ENCOUNTER — Other Ambulatory Visit: Payer: Self-pay | Admitting: Internal Medicine

## 2016-03-04 ENCOUNTER — Other Ambulatory Visit: Payer: Self-pay | Admitting: Internal Medicine

## 2016-03-10 ENCOUNTER — Other Ambulatory Visit: Payer: Self-pay | Admitting: Internal Medicine

## 2016-03-17 ENCOUNTER — Other Ambulatory Visit: Payer: Self-pay | Admitting: Allergy and Immunology

## 2016-03-17 DIAGNOSIS — J3089 Other allergic rhinitis: Secondary | ICD-10-CM

## 2016-04-09 ENCOUNTER — Other Ambulatory Visit: Payer: Self-pay | Admitting: Internal Medicine

## 2016-04-10 ENCOUNTER — Other Ambulatory Visit: Payer: Self-pay | Admitting: Internal Medicine

## 2016-04-17 ENCOUNTER — Encounter: Payer: Self-pay | Admitting: Cardiovascular Disease

## 2016-04-17 ENCOUNTER — Telehealth: Payer: Self-pay | Admitting: Cardiovascular Disease

## 2016-04-17 NOTE — Telephone Encounter (Signed)
Can D/C oral nitrates.  JJB

## 2016-04-17 NOTE — Telephone Encounter (Signed)
Spoke to patient and gave recommendations. She notes she may not be coming back to [place] permanently for a while, but since she still hasn't established w a cardiologist in [place], would like to continue to follow up with [place]. I've advised her to call when she knows she will be returning to [facility] - call in advance, preferably - we can have her return for OV with Dr. Gwenlyn Found or APP as available. Pt voiced thanks and acknowledgment.

## 2016-04-17 NOTE — Telephone Encounter (Signed)
Pt would like to know if she can stop taking her Isosorbide please?

## 2016-04-17 NOTE — Telephone Encounter (Signed)
Pt of Dr. Gwenlyn Found.  She is currently in [place] for next 2 months, working. She seeks advice on whether she needs to take isosorbide.  She has a PCP she's seeing as needed while in [place] who had questioned this due to her last cath being normal.  I asked about symptoms, med efficacy. She notes she doesn't really have any anginal symptoms - aware this could be bc she takes the medication daily as instructed, though.  Advised for now to continue med and will see if Dr. Gwenlyn Found OK's discontinuation. She's aware to schedule follow up appt w [place] when she is back in [place].

## 2016-05-05 ENCOUNTER — Other Ambulatory Visit: Payer: Self-pay | Admitting: Internal Medicine

## 2017-01-26 IMAGING — NM NM MYOCAR MULTI W/ SPECT
3 series · 18 of 18 positions shown · non-contrast
Comparison: none

[Series 1: stress_(id)_sa · 6.5mm · 6.51mm/px · 6 of 64 frames shown (1 of 2)]
[frame 6/64]
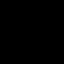
[frame 16/64]
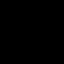
[frame 27/64]
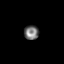
[frame 38/64]
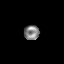
[frame 48/64]
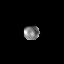
[frame 59/64]
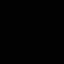

[Series 1: rest_(id)_sa · 6.5mm · 6.51mm/px · 6 of 64 frames shown]
[frame 6/64]
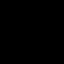
[frame 16/64]
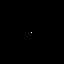
[frame 27/64]
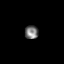
[frame 38/64]
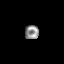
[frame 48/64]
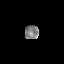
[frame 59/64]
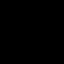

[Series 1: stress_(id)_sa · 6.5mm · 6.51mm/px · 6 of 512 frames shown (2 of 2)]
[frame 43/512]
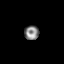
[frame 128/512]
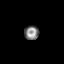
[frame 214/512]
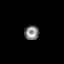
[frame 299/512]
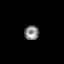
[frame 384/512]
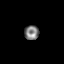
[frame 470/512]
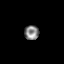

[18 of 18 positions shown; findings below may reference images not displayed]

Canned report from images found in remote index.

Refer to host system for actual result text.

## 2017-04-29 IMAGING — CR DG CHEST 2V
2 series · 2 of 2 positions shown · non-contrast
Comparison: No prior .

CLINICAL DATA: Chest pain.

EXAM:
CHEST  2 VIEW

[view not recorded (1 of 2)]
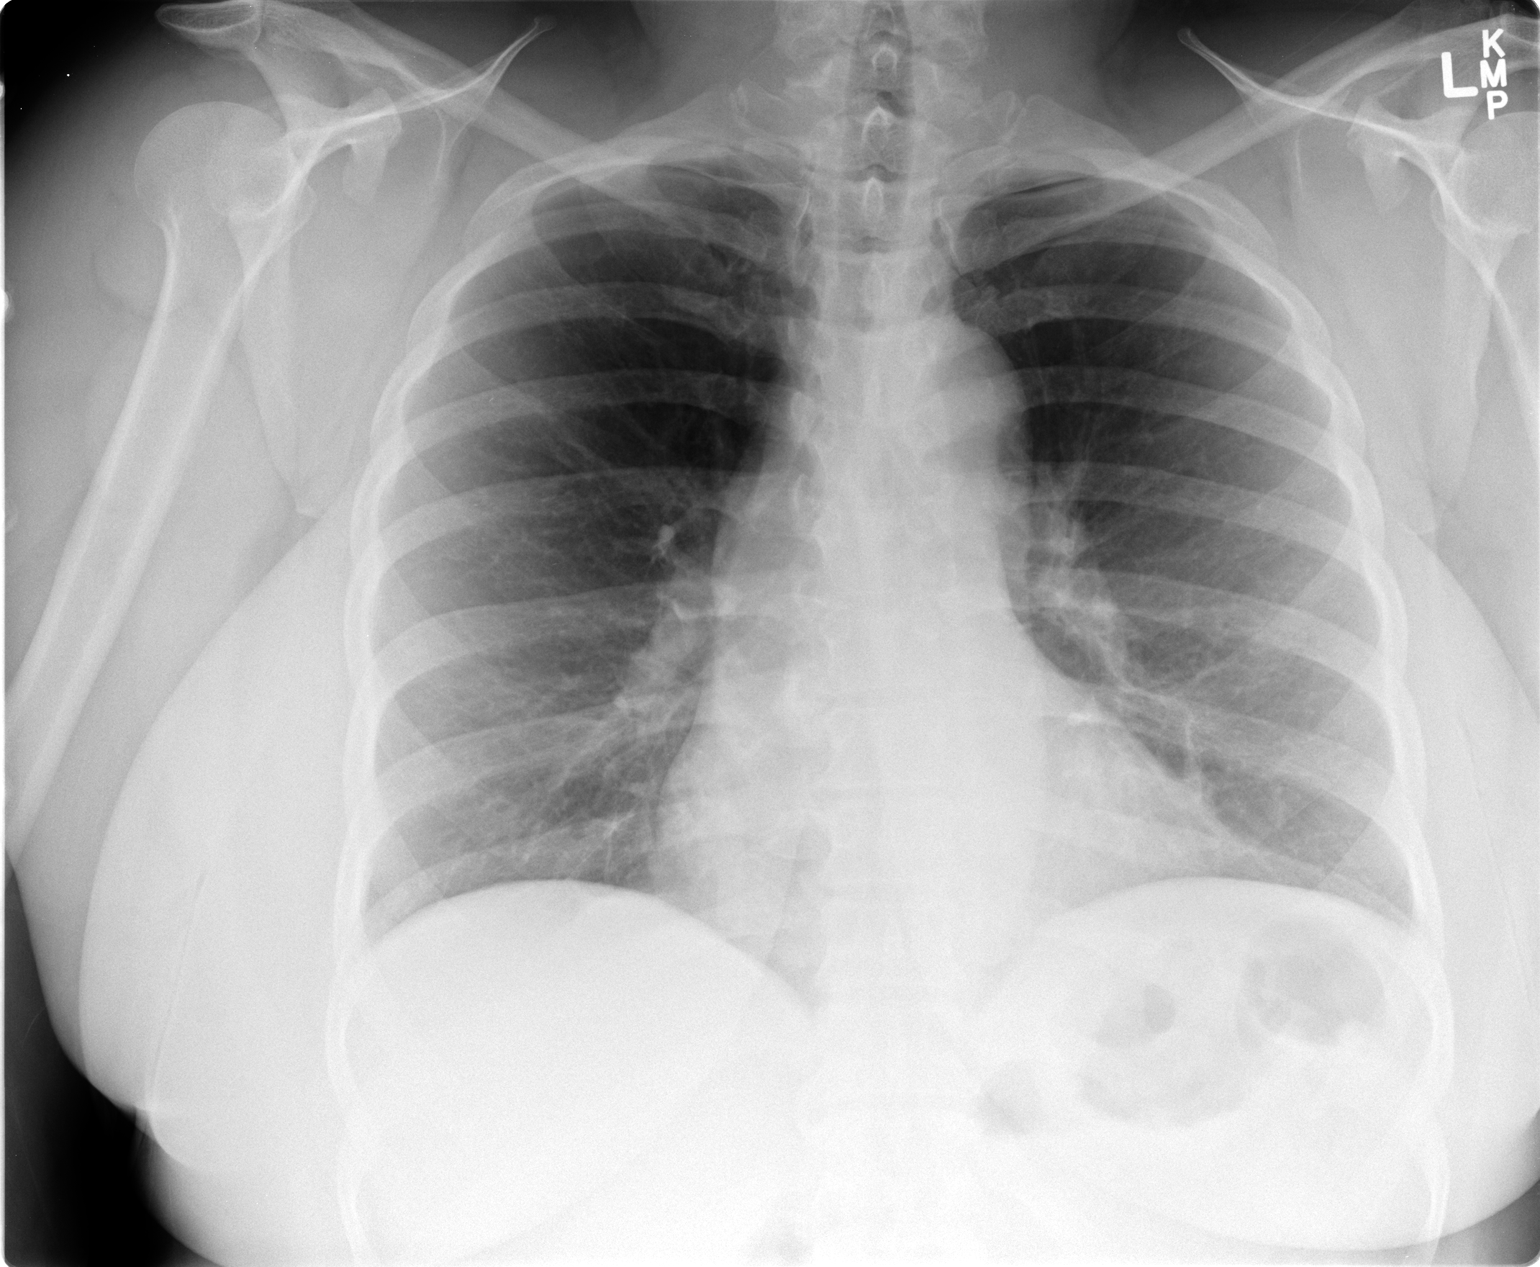

[view not recorded (2 of 2)]
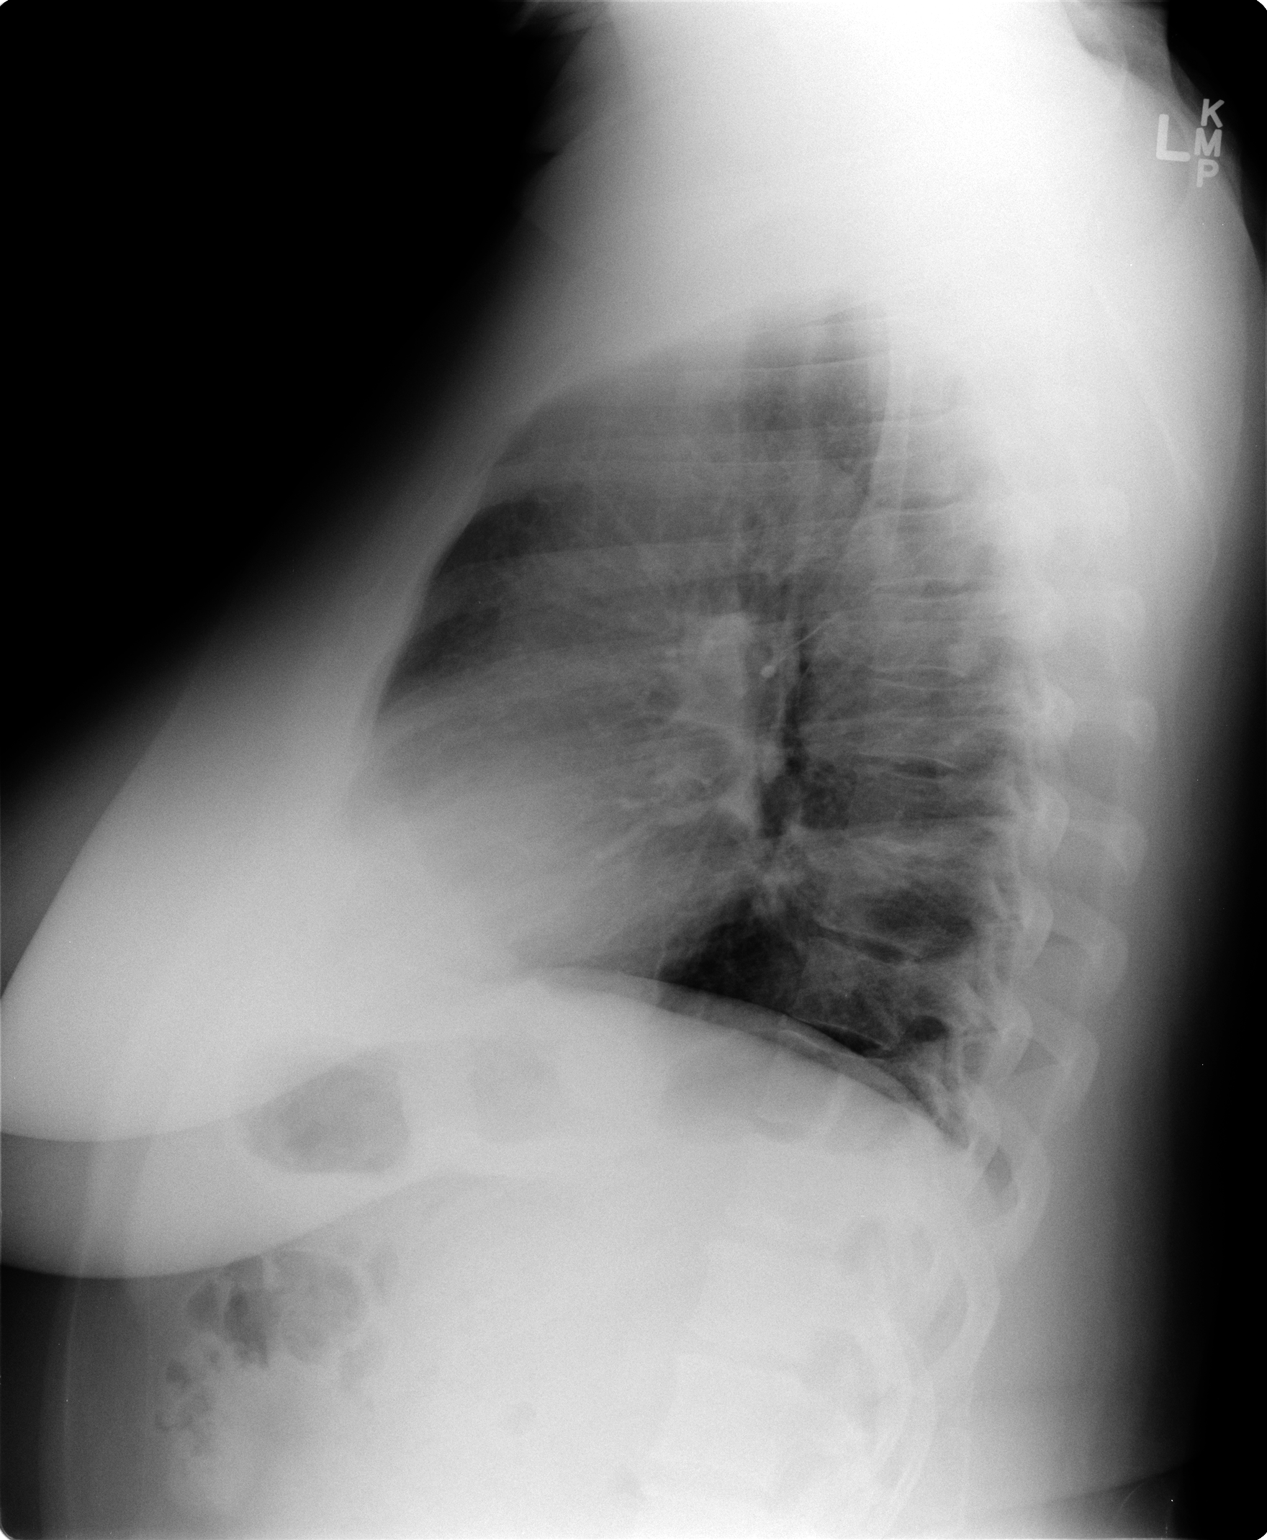

[2 of 2 positions shown; findings below may reference images not displayed]

FINDINGS: Mediastinum hilar structures normal. Mild cardiomegaly. No pulmonary
venous congestion. Basilar subsegmental atelectasis. No pleural
effusion or pneumothorax. No acute bony abnormality.
IMPRESSION: 1. Mild cardiomegaly.  No pulmonary venous congestion.
2. Bibasilar subsegmental atelectasis.
